# Patient Record
Sex: Female | Born: 2000 | Race: White | Hispanic: No | Marital: Single | Smoking: Smoker, current status unknown
Health system: Southern US, Community
[De-identification: ages and names within clinical notes are randomized; demographics above are authoritative.]

## PROBLEM LIST (undated history)

## (undated) DIAGNOSIS — F329 Major depressive disorder, single episode, unspecified: Secondary | ICD-10-CM

## (undated) DIAGNOSIS — R569 Unspecified convulsions: Secondary | ICD-10-CM

## (undated) DIAGNOSIS — F32A Depression, unspecified: Secondary | ICD-10-CM

## (undated) DIAGNOSIS — T43222A Poisoning by selective serotonin reuptake inhibitors, intentional self-harm, initial encounter: Secondary | ICD-10-CM

## (undated) DIAGNOSIS — F419 Anxiety disorder, unspecified: Secondary | ICD-10-CM

## (undated) DIAGNOSIS — H539 Unspecified visual disturbance: Secondary | ICD-10-CM

## (undated) DIAGNOSIS — R519 Headache, unspecified: Secondary | ICD-10-CM

## (undated) DIAGNOSIS — E669 Obesity, unspecified: Secondary | ICD-10-CM

## (undated) DIAGNOSIS — R51 Headache: Secondary | ICD-10-CM

## (undated) DIAGNOSIS — T424X2A Poisoning by benzodiazepines, intentional self-harm, initial encounter: Secondary | ICD-10-CM

## (undated) HISTORY — DX: Unspecified convulsions: R56.9

## (undated) HISTORY — DX: Anxiety disorder, unspecified: F41.9

## (undated) HISTORY — DX: Headache: R51

## (undated) HISTORY — DX: Headache, unspecified: R51.9

## (undated) HISTORY — DX: Unspecified visual disturbance: H53.9

---
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
## 2020-01-02 DIAGNOSIS — G40B09 Juvenile myoclonic epilepsy, not intractable, without status epilepticus: Secondary | ICD-10-CM | POA: Diagnosis not present

## 2020-01-02 DIAGNOSIS — B360 Pityriasis versicolor: Secondary | ICD-10-CM | POA: Diagnosis not present

## 2020-01-02 DIAGNOSIS — Z0289 Encounter for other administrative examinations: Secondary | ICD-10-CM | POA: Diagnosis not present

## 2020-01-02 DIAGNOSIS — Z0001 Encounter for general adult medical examination with abnormal findings: Secondary | ICD-10-CM | POA: Diagnosis not present

## 2020-01-02 DIAGNOSIS — Z23 Encounter for immunization: Secondary | ICD-10-CM | POA: Diagnosis not present

## 2020-01-02 DIAGNOSIS — Z68.41 Body mass index (BMI) pediatric, greater than or equal to 95th percentile for age: Secondary | ICD-10-CM | POA: Diagnosis not present

## 2020-01-02 DIAGNOSIS — F329 Major depressive disorder, single episode, unspecified: Secondary | ICD-10-CM | POA: Diagnosis not present

## 2020-01-02 DIAGNOSIS — Z713 Dietary counseling and surveillance: Secondary | ICD-10-CM | POA: Diagnosis not present

## 2020-01-10 ENCOUNTER — Ambulatory Visit: Payer: Medicaid Other | Admitting: Registered"

## 2020-01-14 ENCOUNTER — Other Ambulatory Visit: Payer: Self-pay

## 2020-01-14 ENCOUNTER — Emergency Department: Payer: Medicaid Other

## 2020-01-14 DIAGNOSIS — Z5321 Procedure and treatment not carried out due to patient leaving prior to being seen by health care provider: Secondary | ICD-10-CM | POA: Diagnosis not present

## 2020-01-14 DIAGNOSIS — G40909 Epilepsy, unspecified, not intractable, without status epilepticus: Secondary | ICD-10-CM | POA: Diagnosis not present

## 2020-01-14 DIAGNOSIS — R569 Unspecified convulsions: Secondary | ICD-10-CM | POA: Diagnosis not present

## 2020-01-14 DIAGNOSIS — M542 Cervicalgia: Secondary | ICD-10-CM | POA: Diagnosis not present

## 2020-01-14 DIAGNOSIS — R000 Tachycardia, unspecified: Secondary | ICD-10-CM | POA: Diagnosis not present

## 2020-01-14 DIAGNOSIS — R404 Transient alteration of awareness: Secondary | ICD-10-CM | POA: Diagnosis not present

## 2020-01-14 LAB — CBC
HCT: 38.7 % (ref 36.0–46.0)
Hemoglobin: 12.3 g/dL (ref 12.0–15.0)
MCH: 26.8 pg (ref 26.0–34.0)
MCHC: 31.8 g/dL (ref 30.0–36.0)
MCV: 84.3 fL (ref 80.0–100.0)
Platelets: 309 10*3/uL (ref 150–400)
RBC: 4.59 MIL/uL (ref 3.87–5.11)
RDW: 14.1 % (ref 11.5–15.5)
WBC: 7.7 10*3/uL (ref 4.0–10.5)
nRBC: 0 % (ref 0.0–0.2)

## 2020-01-14 LAB — BASIC METABOLIC PANEL
Anion gap: 9 (ref 5–15)
BUN: 16 mg/dL (ref 6–20)
CO2: 26 mmol/L (ref 22–32)
Calcium: 9.3 mg/dL (ref 8.9–10.3)
Chloride: 102 mmol/L (ref 98–111)
Creatinine, Ser: 0.76 mg/dL (ref 0.44–1.00)
GFR, Estimated: >60 mL/min (ref >60)
Glucose, Bld: 96 mg/dL (ref 70–99)
Potassium: 3.8 mmol/L (ref 3.5–5.1)
Sodium: 137 mmol/L (ref 135–145)

## 2020-01-14 NOTE — ED Triage Notes (Signed)
Patient reports seizure at home today. Patient woke up on kitchen floor and her mother and mother's boyfriend were standing over her. Patient reports pmh of seizures, takes Lamictal. Patient denies missing doses. Patient AO X 4 at this time.

## 2020-01-15 ENCOUNTER — Telehealth: Payer: Self-pay | Admitting: Emergency Medicine

## 2020-01-15 ENCOUNTER — Emergency Department
Admission: EM | Admit: 2020-01-15 | Discharge: 2020-01-15 | Disposition: A | Discharge status: Home / Self Care | Payer: Medicaid Other | Attending: Emergency Medicine | Admitting: Emergency Medicine

## 2020-01-15 NOTE — Telephone Encounter (Signed)
Called patient due to lwot to inquire about condition and follow up plans. Left message.   

## 2020-01-16 ENCOUNTER — Ambulatory Visit (INDEPENDENT_AMBULATORY_CARE_PROVIDER_SITE_OTHER): Payer: Medicaid Other | Admitting: Family

## 2020-01-16 DIAGNOSIS — G40B09 Juvenile myoclonic epilepsy, not intractable, without status epilepticus: Secondary | ICD-10-CM | POA: Diagnosis not present

## 2020-01-17 ENCOUNTER — Encounter (INDEPENDENT_AMBULATORY_CARE_PROVIDER_SITE_OTHER): Payer: Self-pay | Admitting: Family

## 2020-01-17 ENCOUNTER — Other Ambulatory Visit: Payer: Self-pay

## 2020-01-17 ENCOUNTER — Telehealth (INDEPENDENT_AMBULATORY_CARE_PROVIDER_SITE_OTHER): Payer: Medicaid Other | Admitting: Family

## 2020-01-17 VITALS — Ht 64.25 in | Wt 299.6 lb

## 2020-01-17 DIAGNOSIS — F410 Panic disorder [episodic paroxysmal anxiety] without agoraphobia: Secondary | ICD-10-CM

## 2020-01-17 DIAGNOSIS — G40B09 Juvenile myoclonic epilepsy, not intractable, without status epilepticus: Secondary | ICD-10-CM

## 2020-01-17 NOTE — Progress Notes (Signed)
This is a Pediatric Specialist E-Visit follow up consult provided via  MyChart Cynthia [name] consented to an E-Visit consult today.  Location of patient: Cynthia [name] is at Home(location) Location of provider: Elveria Risingina Len Azeez, NP is at Office (location) Patient was referred by Jackelyn PolingBonney, Warren K, MD   The following participants were involved in this E-Visit: Lorre MunroeFabiola Cardenas, CMA              Elveria Risingina Teniyah Seivert, NP Total time on call: 25 min Follow up: 3 months  Patient: Cynthia BetterKaitlyn Iriah [name] MRN: [id number] Sex: female DOB: 12/28/2000   Provider: Elveria Risingina Brin Ruggerio NP-C [facility] Care[facility]  Visit type: Routine Follow-Up  Last visit: 09/25/2019  Referral source: Jonetta SpeakWarren Bonney, MD History from: Patient, chart  Brief history:  Copied from previous record: History of primary generalized epilepsy, migraines, panic and anxiety. She is taking LamotrigineERand Clonazepam for seizures, andFluoxetine and Hydroxyzine for her mood disorder. She has been [facility] by [facility] for anxiety and panic, which is typically triggered by disagreements with her parents. She also has some anxiety in crowds.She used to take Trokendi XR for migraine prevention but has been able to successfully taper off that medication. She has been hospitalized for overdose attempts with the most recent being in October 2020.  Today's concerns: Cynthia [name] reports to me today that she had a seizure at home on October 17th. She said that she has been working 2nd shift and going to school in the mornings. She is getting about 5 hours of sleep, and the day the seizure occurred she admitted to be tired. Cynthia [name] denies any missed medications. She said that she was not injured in the course of the seizure. EMS was called and she was taken [facility] ER. She said that she was triaged then sent to wait in the lobby After learning that she would likely end up waiting for 10 hours before being  seen, she left the ER and went home.   Cynthia [name] said that she has moved back to her biological mother's home because of disagreements with her stepmother. She said that after a particularly angry event, that her stepmother told her that she was not welcome in that home anymore. Cynthia [name] said that living with her mother has worked out well. She says that her mood is good and that she does not have any thoughts of being sad or down. She is enjoying her job and school, but has talked with her manager about changing her shift so that she can get more sleep each night since the seizure occurred.   Cynthia [name] continues to gain weight and is working with a Scientific laboratory techniciandietician. She has been otherwise generally healthy since she was last seen. She has no other health concerns today other than previously mentioned.   Review of systems: Please see HPI for neurologic and other pertinent review of systems. Otherwise all other systems were reviewed and were negative.  Problem List: Patient Active Problem List   Diagnosis Date Noted  . Intentional drug overdose (HCC)   . Severe episode of recurrent major depressive disorder, without psychotic features (HCC) 07/14/2018  . Migraine without aura and without status migrainosus, not intractable 05/27/2018  . Panic attacks 12/31/2016  . Frequent headaches 03/16/2016  . 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 Anxiety    Phreesia 09/11/2019  . Depression   . Headache   . Intentional SSRI (selective  serotonin reuptake inhibitor) overdose, initial encounter (HCC) 01/10/2019  . Overdose of benzodiazepine, intentional self-harm, initial encounter (HCC) 01/10/2019  . Seizure (HCC)   . Seizures (HCC)    Phreesia 09/11/2019  . Vision abnormalities     Past medical history comments: See HPI Copied from previous record: She took Keppra in the past but was changed to Trokendi XR in 2017  because of ongoing seizures and for migraine prevention.An EEG performed January 08, 2017 was normal.An EEG performed February 19, 2016 was consistent with primary generalized epilepsy.An EEG done on February 07, 2018 was normal awake and asleep.Keppra was restarted in 2019 because of seizure activity but stopped due to side effects  Surgical history: Past Surgical History:  Procedure Laterality Date  . TONSILLECTOMY Bilateral 2007   Performed at [facility]  . TYMPANOSTOMY TUBE PLACEMENT Bilateral 2004   Performed at [facility]     Family history: family history includes Anxiety disorder in her maternal aunt, maternal grandmother, and mother; Autism in an other family member; Bipolar disorder in her maternal aunt, maternal grandmother, and mother; Depression in her maternal aunt, maternal grandmother, and mother; Hypertension in her father, maternal grandfather, maternal grandmother, mother, paternal grandfather, and paternal grandmother; Lung cancer in her paternal grandfather; Migraines in her maternal aunt, maternal grandfather, maternal grandmother, and mother; Seizures in an other family member.   Social history: Social History   Socioeconomic History  . Marital status: Single    Spouse name: Not on file  . Number of children: Not on file  . Years of education: Not on file  . Highest education level: Not on file  Occupational History  . Not on file  Tobacco Use  . Smoking status: Never Smoker  . Smokeless tobacco: Never Used  Vaping Use  . Vaping Use: Never used  Substance and Sexual Activity  . Alcohol use: No  . Drug use: No  . Sexual activity: Yes  Other Topics Concern  . 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   She attended [employer].    Lives with her father, step-mother, younger paternal half sister, and family dog.   Will be attending [employer] in the fall.   She will be majoring in Nursing   Social Determinants of  Health   Financial Resource Strain:   . Difficulty of Paying Living Expenses: Not on file  Food Insecurity:   . Worried About Programme researcher, broadcasting/film/video in the Last Year: Not on file  . Ran Out of Food in the Last Year: Not on file  Transportation Needs:   . Lack of Transportation (Medical): Not on file  . Lack of Transportation (Non-Medical): Not on file  Physical Activity:   . Days of Exercise per Week: Not on file  . Minutes of Exercise per Session: Not on file  Stress:   . Feeling of Stress : Not on file  Social Connections:   . Frequency of Communication with Friends and Family: Not on file  . Frequency of Social Gatherings with Friends and Family: Not on file  . Attends Religious Services: Not on file  . Active Member of Clubs or Organizations: Not on file  . Attends Banker Meetings: Not on file  . Marital Status: Not on file  Intimate Partner Violence:   . Fear of Current or Ex-Partner: Not on file  . Emotionally Abused: Not on file  . Physically Abused: Not on file  . Sexually  Abused: Not on file    Past/failed meds: Copied from previous record: Keppra - side effects Trokendi XR - improved headaches  Allergies: Allergies  Allergen Reactions  . Tape Dermatitis    When regular hospital tape applied to arm, swelling and redness developed at the site. Where paper tape applied, no reaction noted.     Immunizations: Immunization History  Administered Date(s) Administered  . Influenza,inj,Quad PF,6+ Mos 01/12/2019     Diagnostics/Screenings: Copied from previous record: rEEG 02/07/18 - normal awake and asleep. Regino Schultze, MD  rEEG 02/19/16 - abnormal with the patient awake, drowsy and asleep. The presence of generalized spike and wave discharge is consistent with her primary generalized epilepsy and raises the risk of recurrent seizures. Regino Schultze, MD  Physical Exam: Ht 5' 4.25" (1.632 m) Comment: reported  Wt 299 lb 9.6 oz (135.9 kg) Comment:  reported  BMI 51.03 kg/m    Wt Readings from Last 3 Encounters:  01/17/20 299 lb 9.6 oz (135.9 kg) (>99 %, Z= 2.77)*  01/14/20 290 lb (131.5 kg) (>99 %, Z= 2.73)*  04/19/19 240 lb (108.9 kg) (>99 %, Z= 2.38)*   * Growth percentiles are based on CDC (Girls, 2-20 Years) data.   General: Well developed, well nourished, obese young woman, seated at her home, in no evident distress, sandy hair, blue eyes, right handed Head: Head normocephalic and atraumatic Neck: Supple Musculoskeletal: No obvious deformities or scoliosis Skin: No rashes or neurocutaneous lesions  Neurologic Exam Mental Status: Awake and fully alert.  Oriented to place and time.  Recent and remote memory intact.  Attention span, concentration, and fund of knowledge appropriate.  Mood and affect appropriate. Cranial Nerves: Extraocular movements full without nystagmus. Hearing intact and symmetric to voice on video.  Facial sensation intact.  Face and tongue move normally and symmetrically.  Neck flexion and extension normal. Motor: Normal functional bulk, tone and strength Sensory: Intact to touch and temperature in all extremities.  Coordination: Finger-to-nose performed accurately bilaterally. Gait and Station: Arises from chair without difficulty.  Stance is normal. Gait demonstrates normal stride length and balance.   Impression: 1. Primary generalized epilepsy 2. Migraine without aura 3. Anxiety and panic disorder 4. Depression with previous intentional overdose attempts  Recommendations for plan of care: The patient's previous [facility] records were reviewed. Taneshia has neither had nor required imaging or lab studies since the last visit. She is an 19 year old young woman with history of primary generalized epilepsy, migraine headaches, anxiety and panic disorders, and history of depression with previous intentional overdose attempts. She is taking and tolerating Lamotrigine ER and Clonazepam for seizures and had  remained seizure free for some time until she had a seizure in the setting of sleep deprivation on October 17th. I talked with [facility] and reviewed the need for her to get at least 8 hours of sleep each night and continuing to be compliant with her medication. I told her that she should not drive on days in which she did not get sufficient sleep the night before. I asked Tonette to let me know if she any more seizures.   Liseth's mood has improved over time and she is doing well at this time. Unfortunately she has gained considerable weight and I encouraged her to continue to work with a dietician to get back to a healthy weight for her.  I will otherwise see her back in follow up in 3 months or sooner if needed. Tameah agreed with the plans made today.  The medication list was reviewed and reconciled. No changes were made in the prescribed medications today. A complete medication list was provided to the patient.  Allergies as of 01/17/2020      Reactions   Tape Dermatitis   When regular hospital tape applied to arm, swelling and redness developed at the site. Where paper tape applied, no reaction noted.       Medication List       Accurate as of January 17, 2020 11:04 AM. If you have any questions, ask your nurse or doctor.        FLUoxetine 20 MG capsule Commonly known as: PROZAC Take 1 capsule (20 mg total) by mouth daily.   hydrOXYzine 25 MG tablet Commonly known as: ATARAX/VISTARIL Take 1 tablet (25 mg total) by mouth at bedtime as needed for anxiety.   LamoTRIgine 300 MG Tb24 24 hour tablet Take 1 tablet at bedtime   MELATONIN PO Take by mouth.   metroNIDAZOLE 500 MG tablet Commonly known as: FLAGYL Take 1 tablet (500 mg total) by mouth 2 (two) times daily.   Nexplanon 68 MG Impl implant Generic drug: etonogestrel 1 each by Subdermal route once.   pantoprazole 40 MG tablet Commonly known as: Protonix Take 1 tablet per day   UNABLE TO FIND keratin        Total time spent with the patient was 25 minutes, of which 50% or more was spent in counseling and coordination of care.  [name] NP-C [facility] Ph. [phone] Fax [phone]

## 2020-01-20 ENCOUNTER — Encounter (INDEPENDENT_AMBULATORY_CARE_PROVIDER_SITE_OTHER): Payer: Self-pay | Admitting: Family

## 2020-01-20 NOTE — Patient Instructions (Signed)
Thank you for meeting with me by video visit today.   Instructions for you until your next appointment are as follows: 1. Continue taking your medications as prescribed 2. Remember that it is very important for you to get at least 8 hours of sleep each night 3. Let me know if you have any more seizures 4. Continue working with a dietician about your weight 5. Please sign up for MyChart if you have not done so 6. Please plan to return for follow up in 3 months or sooner if needed.

## 2020-02-05 ENCOUNTER — Ambulatory Visit: Payer: Self-pay | Admitting: Registered"

## 2020-02-08 ENCOUNTER — Ambulatory Visit: Payer: Medicaid Other | Admitting: Registered"

## 2020-03-06 ENCOUNTER — Encounter: Payer: Medicaid Other | Attending: Family | Admitting: Registered"

## 2020-04-08 ENCOUNTER — Other Ambulatory Visit (INDEPENDENT_AMBULATORY_CARE_PROVIDER_SITE_OTHER): Payer: Self-pay | Admitting: Family

## 2020-04-08 DIAGNOSIS — F410 Panic disorder [episodic paroxysmal anxiety] without agoraphobia: Secondary | ICD-10-CM

## 2020-04-08 DIAGNOSIS — K219 Gastro-esophageal reflux disease without esophagitis: Secondary | ICD-10-CM

## 2020-04-08 DIAGNOSIS — G40B09 Juvenile myoclonic epilepsy, not intractable, without status epilepticus: Secondary | ICD-10-CM

## 2020-04-08 MED ORDER — HYDROXYZINE HCL 25 MG PO TABS: 25.0000 mg | ORAL_TABLET | Freq: Every evening | ORAL | 2 refills | Status: DC | PRN

## 2020-04-08 MED ORDER — PANTOPRAZOLE SODIUM 40 MG PO TBEC: DELAYED_RELEASE_TABLET | ORAL | 2 refills | Status: DC

## 2020-04-08 MED ORDER — LAMOTRIGINE ER 300 MG PO TB24: ORAL_TABLET | ORAL | 5 refills | Status: DC

## 2020-04-08 MED ORDER — FLUOXETINE HCL 20 MG PO CAPS: 20.0000 mg | ORAL_CAPSULE | Freq: Every day | ORAL | 2 refills | Status: DC

## 2020-04-13 ENCOUNTER — Other Ambulatory Visit: Payer: Self-pay

## 2020-04-13 ENCOUNTER — Encounter (HOSPITAL_COMMUNITY): Payer: Self-pay | Admitting: Emergency Medicine

## 2020-04-13 ENCOUNTER — Emergency Department (HOSPITAL_COMMUNITY)
Admission: EM | Admit: 2020-04-13 | Discharge: 2020-04-14 | Disposition: A | Discharge status: Home / Self Care | Payer: Medicaid Other | Attending: Emergency Medicine | Admitting: Emergency Medicine

## 2020-04-13 DIAGNOSIS — K625 Hemorrhage of anus and rectum: Secondary | ICD-10-CM | POA: Diagnosis not present

## 2020-04-13 LAB — COMPREHENSIVE METABOLIC PANEL
ALT: 31 U/L (ref 0–44)
AST: 28 U/L (ref 15–41)
Albumin: 4.2 g/dL (ref 3.5–5.0)
Alkaline Phosphatase: 72 U/L (ref 38–126)
Anion gap: 10 (ref 5–15)
BUN: 18 mg/dL (ref 6–20)
CO2: 27 mmol/L (ref 22–32)
Calcium: 9.2 mg/dL (ref 8.9–10.3)
Chloride: 104 mmol/L (ref 98–111)
Creatinine, Ser: 1.19 mg/dL — ABNORMAL HIGH (ref 0.44–1.00)
GFR, Estimated: >60 mL/min (ref >60)
Glucose, Bld: 101 mg/dL — ABNORMAL HIGH (ref 70–99)
Potassium: 4 mmol/L (ref 3.5–5.1)
Sodium: 141 mmol/L (ref 135–145)
Total Bilirubin: 0.8 mg/dL (ref 0.3–1.2)
Total Protein: 7.9 g/dL (ref 6.5–8.1)

## 2020-04-13 LAB — CBC
HCT: 40 % (ref 36.0–46.0)
Hemoglobin: 12.6 g/dL (ref 12.0–15.0)
MCH: 26.5 pg (ref 26.0–34.0)
MCHC: 31.5 g/dL (ref 30.0–36.0)
MCV: 84 fL (ref 80.0–100.0)
Platelets: 325 10*3/uL (ref 150–400)
RBC: 4.76 MIL/uL (ref 3.87–5.11)
RDW: 15.4 % (ref 11.5–15.5)
WBC: 9.2 10*3/uL (ref 4.0–10.5)
nRBC: 0 % (ref 0.0–0.2)

## 2020-04-13 LAB — I-STAT BETA HCG BLOOD, ED (MC, WL, AP ONLY): I-stat hCG, quantitative: <5 m[IU]/mL (ref <5)

## 2020-04-13 LAB — TYPE AND SCREEN
ABO/RH(D): A POS
Antibody Screen: NEGATIVE

## 2020-04-13 LAB — POC OCCULT BLOOD, ED: Fecal Occult Bld: NEGATIVE

## 2020-04-13 NOTE — Discharge Instructions (Addendum)
There was no blood in your stool tonight.  The blood tests were normal.  Urine did not show blood either. Follow up with your doctor  to discuss further evaluation

## 2020-04-13 NOTE — ED Provider Notes (Signed)
[facility]-EMERGENCY DEPT Provider Note   CSN: [id number] Arrival date & time: 04/13/20  1510     History Chief Complaint  Patient presents with  . Rectal Bleeding    Cynthia [name] is a 20 y.o. female.  HPI   Patient states she had an episode of bleeding this morning when she went to use the bathroom.  Patient states she urinated this morning and when she wiped she noticed blood on the toilet tissue.  Patient was not sure if it was coming from her bladder or her rectum.  She is not having any trouble with any abdominal pain.  No vomiting or diarrhea.  Patient does not think it is vaginal bleeding because she states she does not have any menstrual periods with her controlled medications.  She denies any abdominal pain.  No lightheadedness.  Past Medical History:  Diagnosis Date  . Anxiety    Phreesia 09/11/2019  . Depression   . Headache   . Intentional SSRI (selective serotonin reuptake inhibitor) overdose, initial encounter (HCC) 01/10/2019  . Overdose of benzodiazepine, intentional self-harm, initial encounter (HCC) 01/10/2019  . Seizure (HCC)   . Seizures (HCC)    Phreesia 09/11/2019  . Vision abnormalities     Patient Active Problem List   Diagnosis Date Noted  . Intentional drug overdose (HCC)   . Severe episode of recurrent major depressive disorder, without psychotic features (HCC) 07/14/2018  . Migraine without aura and without status migrainosus, not intractable 05/27/2018  . Panic attacks 12/31/2016  . Frequent headaches 03/16/2016  . 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 TONSILLECTOMY Bilateral 2007   Performed at [facility]  . TYMPANOSTOMY TUBE PLACEMENT Bilateral 2004   Performed at [facility]     OB History   No obstetric history on file.     Family History  Problem Relation Age of Onset  . Migraines Mother   . Bipolar  disorder Mother   . Depression Mother   . Anxiety disorder Mother   . Hypertension Mother   . Hypertension Father   . Migraines Maternal Grandmother   . Bipolar disorder Maternal Grandmother   . Depression Maternal Grandmother   . Anxiety disorder Maternal Grandmother   . Hypertension Maternal Grandmother   . Migraines Maternal Grandfather   . Hypertension Maternal Grandfather   . Hypertension Paternal Grandmother   . Lung cancer Paternal Grandfather   . Hypertension Paternal Grandfather   . Migraines Maternal Aunt   . Bipolar disorder Maternal Aunt        Both Maternal Aunts   . Depression Maternal Aunt   . Anxiety disorder Maternal Aunt   . Seizures Other        MGA  . Autism Other        Maternal 1st    Social History   Tobacco Use  . Smoking status: Never Smoker  . Smokeless tobacco: Never Used  Vaping Use  . Vaping Use: Never used  Substance Use Topics  . Alcohol use: No  . Drug use: No    Home Medications Prior to Admission medications   Medication Sig Start Date End Date Taking? Authorizing Provider  etonogestrel (NEXPLANON) 68 MG IMPL implant 1 each by Subdermal route once.    Yes [provider]  FLUoxetine (PROZAC) 20 MG capsule Take 1 capsule (20 mg total) by mouth daily. 04/08/20  Yes [name], NP  hydrOXYzine (ATARAX/VISTARIL)  25 MG tablet Take 1 tablet (25 mg total) by mouth at bedtime as needed for anxiety. 04/08/20  Yes [name], NP  LamoTRIgine 300 MG TB24 24 hour tablet Take 1 tablet at bedtime 04/08/20  Yes Goodpasture, Inetta Fermo, NP  MELATONIN PO Take 1 tablet by mouth at bedtime as needed (sleep).   Yes [provider]  metroNIDAZOLE (FLAGYL) 500 MG tablet Take 1 tablet (500 mg total) by mouth 2 (two) times daily. Patient not taking: No sig reported 11/20/19   Reva Bores, MD  pantoprazole (PROTONIX) 40 MG tablet Take 1 tablet per day Patient taking differently: Take 40 mg by mouth daily as needed (heartburn). 04/08/20    [name], NP    Allergies    Tape  Review of Systems   Review of Systems  All other systems reviewed and are negative.   Physical Exam Updated Vital Signs BP 102/62   Pulse 80   Temp 99.7 F (37.6 C) (Oral)   Resp 16   Ht 1.638 m (5' 4.5")   Wt (!) 137 kg   SpO2 100%   BMI 51.04 kg/m   Physical Exam Vitals and nursing note reviewed.  Constitutional:      General: She is not in acute distress.    Appearance: She is well-developed and well-nourished.  HENT:     Head: Normocephalic and atraumatic.     Right Ear: External ear normal.     Left Ear: External ear normal.  Eyes:     General: No scleral icterus.       Right eye: No discharge.        Left eye: No discharge.     Conjunctiva/sclera: Conjunctivae normal.  Neck:     Trachea: No tracheal deviation.  Cardiovascular:     Rate and Rhythm: Normal rate and regular rhythm.     Pulses: Intact distal pulses.  Pulmonary:     Effort: Pulmonary effort is normal. No respiratory distress.     Breath sounds: Normal breath sounds. No stridor. No wheezing or rales.  Abdominal:     General: Bowel sounds are normal. There is no distension.     Palpations: Abdomen is soft.     Tenderness: There is no abdominal tenderness. There is no guarding or rebound.  Genitourinary:    Comments: No external hemorrhoid noted, no gross blood noted on rectal exam Musculoskeletal:        General: No tenderness or edema.     Cervical back: Neck supple.  Skin:    General: Skin is warm and dry.     Findings: No rash.  Neurological:     Mental Status: She is alert.     Cranial Nerves: No cranial nerve deficit (no facial droop, extraocular movements intact, no slurred speech).     Sensory: No sensory deficit.     Motor: No abnormal muscle tone or seizure activity.     Coordination: Coordination normal.     Deep Tendon Reflexes: Strength normal.  Psychiatric:        Mood and Affect: Mood and affect normal.     ED Results /  Procedures / Treatments   Labs (all labs ordered are listed, but only abnormal results are displayed) Labs Reviewed  COMPREHENSIVE METABOLIC PANEL - Abnormal; Notable for the following components:      Result Value   Glucose, Bld 101 (*)    Creatinine, Ser 1.19 (*)    All other components within normal limits  CBC  URINALYSIS, ROUTINE W REFLEX MICROSCOPIC  POC OCCULT BLOOD, ED  I-STAT BETA HCG BLOOD, ED ([facility], [facility], [facility] ONLY)  TYPE AND SCREEN  ABO/RH    EKG None  Radiology No results found.  Procedures Procedures (including critical care time)  Medications Ordered in ED Medications - No data to display  ED Course  I have reviewed the triage vital signs and the nursing notes.  Pertinent labs & imaging results that were available during my care of the patient were reviewed by me and considered in my medical decision making (see chart for details).  Clinical Course as of 04/13/20 2251  Sat Apr 13, 2020  2216 Patient's Hemoccult is negative. [JK]  2216 CBC and metabolic panel unremarkable. [JK]    Clinical Course User Index [JK] Linwood Dibbles, MD   MDM Rules/Calculators/A&P                          Patient presented to ED with complaints of rectal bleeding.  Patient was otherwise asymptomatic.  In the ED rectal exam did not show any evidence of blood.  No hemorrhoid was noted.  Laboratory tests are unremarkable.  Patient had urinated before she saw the blood so urinalysis is pending.  It is possible she noticed hematuria.  Ua suggestive of contamination.  Stable for discharge.  Output follow up prn if sx return.  ?anal fissure, internal hemorrhoid  Final Clinical Impression(s) / ED Diagnoses Final diagnoses:  Rectal bleeding    Rx / DC Orders ED Discharge Orders    None       Linwood Dibbles, MD 04/14/20 1302

## 2020-04-13 NOTE — ED Triage Notes (Signed)
Patient c/o one episode of dark red blood from rectum this morning. Reports abdominal pain. Denies N/V.

## 2020-04-13 NOTE — ED Notes (Signed)
Pt made aware urine specimen needed.  

## 2020-04-14 LAB — URINALYSIS, ROUTINE W REFLEX MICROSCOPIC
Bilirubin Urine: NEGATIVE
Glucose, UA: NEGATIVE mg/dL
Hgb urine dipstick: NEGATIVE
Ketones, ur: NEGATIVE mg/dL
Nitrite: NEGATIVE
Protein, ur: NEGATIVE mg/dL
Specific Gravity, Urine: 1.025 (ref 1.005–1.030)
pH: 5 (ref 5.0–8.0)

## 2020-04-18 DIAGNOSIS — K5900 Constipation, unspecified: Secondary | ICD-10-CM | POA: Diagnosis not present

## 2020-04-19 ENCOUNTER — Telehealth (INDEPENDENT_AMBULATORY_CARE_PROVIDER_SITE_OTHER): Payer: Medicaid Other | Admitting: Family

## 2020-04-25 ENCOUNTER — Telehealth (INDEPENDENT_AMBULATORY_CARE_PROVIDER_SITE_OTHER): Payer: Medicaid Other | Admitting: Family

## 2020-04-25 ENCOUNTER — Other Ambulatory Visit: Payer: Self-pay

## 2020-04-25 VITALS — Ht 64.5 in | Wt 305.0 lb

## 2020-04-25 DIAGNOSIS — G40B09 Juvenile myoclonic epilepsy, not intractable, without status epilepticus: Secondary | ICD-10-CM

## 2020-04-25 DIAGNOSIS — F410 Panic disorder [episodic paroxysmal anxiety] without agoraphobia: Secondary | ICD-10-CM

## 2020-04-25 DIAGNOSIS — Z8659 Personal history of other mental and behavioral disorders: Secondary | ICD-10-CM

## 2020-04-25 NOTE — Progress Notes (Signed)
This is a Pediatric Specialist E-Visit follow up consult provided via Caregility Video Jacayla Nordell Intriago consented to an E-Visit consult today.  Location of patient: Cynthia [name] is at home Location of provider: Damita Dunnings is at office Patient was referred by Jackelyn Poling, MD   The following participants were involved in this E-Visit: CMA, NP and patient  Chief Complain/ Reason for E-Visit today: seizure follow up Total time on call: 15 minutes Follow up: 3 months  Cynthia [name]   MRN:  [id number]  2000/06/06   Provider: [name] NP-C Location of Care: [facility]  Visit type: Routine Follow-Up  Last visit: 01/17/2020  Referral source: Starr Lake, MD History from: patient, [facility] chart  Brief history:  Copied from previous record: History of primary generalized epilepsy, migraines, panic and anxiety. She is taking LamotrigineERand Clonazepam for seizures, andFluoxetineand Hydroxyzinefor her mood disorder. She has been [facility] by [facility] for anxiety and panic, which is typically triggered by disagreements with her parents. She also has some anxiety in crowds.She used to take Trokendi XR for migraine prevention but has been able to successfully taper off that medication. She has been hospitalized for overdose attempts with the most recent being in October 2020.  Today's concerns: [name] reports today that she has remained seizure free since her last visit and says that she is compliant with her medication. She has a busy schedule with college classes and 2 part time jobs, but says that she gets at least 8 hours of sleep most nights.   [name] reports that her mood is good and that she is happy being busy and active. She says that her anxiety is much better than in the past.   [name] currently has Covid-19 infection and says that she is weathering that well. She has been otherwise generally healthy since she was  last seen. She has no other health concerns today other than previously mentioned.  Review of systems: Please see HPI for neurologic and other pertinent review of systems. Otherwise all other systems were reviewed and were negative.  Problem List: Patient Active Problem List   Diagnosis Date Noted  . Intentional drug overdose (HCC)   . Severe episode of recurrent major depressive disorder, without psychotic features (HCC) 07/14/2018  . Migraine without aura and without status migrainosus, not intractable 05/27/2018  . Panic attacks 12/31/2016  . Frequent headaches 03/16/2016  . 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 Anxiety    Phreesia 09/11/2019  . Depression   . Depression    Phreesia 04/25/2020  . Headache   . Intentional SSRI (selective serotonin reuptake inhibitor) overdose, initial encounter (HCC) 01/10/2019  . Overdose of benzodiazepine, intentional self-harm, initial encounter (HCC) 01/10/2019  . Seizure (HCC)   . Seizures (HCC)    Phreesia 09/11/2019  . Vision abnormalities     Past medical history comments: See HPI Copied from previous record: She took Keppra in the past but was changed to Trokendi XR in 2017 because of ongoing seizures and for migraine prevention.An EEG performed January 08, 2017 was normal.An EEG performed February 19, 2016 was consistent with primary generalized epilepsy.An EEG done on February 07, 2018 was normal awake and asleep.Keppra was restarted in 2019 because of seizure activity but stopped due to side effects  Surgical history: Past Surgical History:  Procedure Laterality Date  . TONSILLECTOMY Bilateral 2007   Performed at [facility]  .  TYMPANOSTOMY TUBE PLACEMENT Bilateral 2004   Performed at [facility]     Family history: family history includes Anxiety disorder in her maternal aunt, maternal grandmother, and mother; Autism in an other family  member; Bipolar disorder in her maternal aunt, maternal grandmother, and mother; Depression in her maternal aunt, maternal grandmother, and mother; Hypertension in her father, maternal grandfather, maternal grandmother, mother, paternal grandfather, and paternal grandmother; Lung cancer in her paternal grandfather; Migraines in her maternal aunt, maternal grandfather, maternal grandmother, and mother; Seizures in an other family member.   Social history: Social History   Socioeconomic History  . Marital status: Single    Spouse name: Not on file  . Number of children: Not on file  . Years of education: Not on file  . Highest education level: Not on file  Occupational History  . Not on file  Tobacco Use  . Smoking status: Never Smoker  . Smokeless tobacco: Never Used  Vaping Use  . Vaping Use: Never used  Substance and Sexual Activity  . Alcohol use: No  . Drug use: No  . Sexual activity: Yes  Other Topics Concern  . 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   She attended [employer].    Lives with her father, step-mother, younger paternal half sister, and family dog.   She is attending [employer] in [place] for Nursing.   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    Social Determinants of Health   Financial Resource Strain: Not on file  Food Insecurity: Not on file  Transportation Needs: Not on file  Physical Activity: Not on file  Stress: Not on file  Social Connections: Not on file  Intimate Partner Violence: Not on file    Past/failed meds: Copied from previous record: Keppra - side effects Trokendi XR - improved headaches  Allergies: Allergies  Allergen Reactions  . Tape Dermatitis    When regular hospital tape applied to arm, swelling and redness developed at the site. Where paper tape applied, no reaction noted.      Immunizations: Immunization History  Administered Date(s) Administered  . Influenza,inj,Quad PF,6+  Mos 01/12/2019     Diagnostics/Screenings: Copied from previous record: rEEG 02/07/18 - normal awake and asleep. Regino Schultze, MD  rEEG 02/19/16 - abnormal with the patient awake, drowsy and asleep. The presence of generalized spike and wave discharge is consistent with her primary generalized epilepsy and raises the risk of recurrent seizures. Regino Schultze, MD   Physical Exam: Ht 5' 4.5" (1.638 m) Comment: reported  Wt (!) 305 lb (138.3 kg) Comment: reported  BMI 51.54 kg/m   Wt Readings from Last 3 Encounters:  04/25/20 (!) 305 lb (138.3 kg) (>99 %, Z= 2.83)*  04/13/20 (!) 302 lb (137 kg) (>99 %, Z= 2.81)*  01/17/20 299 lb 9.6 oz (135.9 kg) (>99 %, Z= 2.77)*   * Growth percentiles are based on CDC (Girls, 2-20 Years) data.   General: Well developed, well nourished obese young woman, seated at her home, in no evident distress, sandy hair, blue eyes, right handed Head: Head normocephalic and atraumatic. Neck: Supple Musculoskeletal: No obvious deformities or scoliosis Skin: No rashes or neurocutaneous lesions  Neurologic Exam Mental Status: Awake and fully alert.  Oriented to place and time.  Recent and remote memory intact.  Attention span, concentration, and fund of knowledge appropriate.  Mood and affect appropriate. Cranial Nerves: Extraocular movements full without nystagmus. Hearing intact and  symmetric to voice on video.  Facial sensation intact.  Face tongue, palate move normally and symmetrically.  Motor: Normal functional bulk, tone and strength Sensory: Intact to touch and temperature in all extremities.  Coordination: Finger-to-nose and heel-to shin performed accurately bilaterally.  Gait and Station: Arises from chair without difficulty.  Stance is normal. Gait demonstrates normal stride length and balance.   Impression: 1. Primary generalized epilepsy 2. Migraine without aura 3. Anxiety and panic disorder 4. Depression with previous intentional overdose  attempts  Recommendations for plan of care: The patient's previous [facility] records were reviewed. Kaileah has neither had nor required imaging or lab studies since the last visit. She is a 20 year old young woman with history of primary generalized epilepsy, migraine headaches, anxiety and panic disorder, as well as history of depression. She is taking and tolerating Lamotrigine and has remained seizure free. She is taking Fluoxetine for her mood disorder and is doing well at this time. Kenyah is sick today with Covid-19 infection so I did not talk with her about her weight gain. I will see her back in follow up in 3 months or sooner if needed. Jeriyah agreed with the plans made today.   The medication list was reviewed and reconciled. No changes were made in the prescribed medications today. A complete medication list was provided to the patient.  Allergies as of 04/25/2020      Reactions   Tape Dermatitis   When regular hospital tape applied to arm, swelling and redness developed at the site. Where paper tape applied, no reaction noted.       Medication List       Accurate as of April 25, 2020 11:59 PM. If you have any questions, ask your nurse or doctor.        STOP taking these medications   metroNIDAZOLE 500 MG tablet Commonly known as: FLAGYL Stopped by: [name], NP     TAKE these medications   FLUoxetine 20 MG capsule Commonly known as: PROZAC Take 1 capsule (20 mg total) by mouth daily.   hydrOXYzine 25 MG tablet Commonly known as: ATARAX/VISTARIL Take 1 tablet (25 mg total) by mouth at bedtime as needed for anxiety.   LamoTRIgine 300 MG Tb24 24 hour tablet Take 1 tablet at bedtime   MELATONIN PO Take 1 tablet by mouth at bedtime as needed (sleep).   Nexplanon 68 MG Impl implant Generic drug: etonogestrel 1 each by Subdermal route once.   pantoprazole 40 MG tablet Commonly known as: Protonix Take 1 tablet per day       Total time spent with the  patient was 15 minutes, of which 50% or more was spent in counseling and coordination of care.  [name] NP-C [facility] Child [facility]rology Ph. [phone] Fax [phone]

## 2020-04-28 ENCOUNTER — Encounter (INDEPENDENT_AMBULATORY_CARE_PROVIDER_SITE_OTHER): Payer: Self-pay | Admitting: Family

## 2020-04-28 DIAGNOSIS — Z8659 Personal history of other mental and behavioral disorders: Secondary | ICD-10-CM | POA: Insufficient documentation

## 2020-04-28 NOTE — Patient Instructions (Signed)
Thank you for meeting with me by video today.   Instructions for you until your next appointment are as follows: 1. Continue taking your seizure medicine as prescribed. Try not to miss any doses.  2. Continue taking the Fluoxetine as prescribed. Try not to miss any doses.  3. Let me know if you have any seizures.  4. Please plan to return for follow up in 3 months or sooner if needed.

## 2020-05-17 DIAGNOSIS — R4020 Unspecified coma: Secondary | ICD-10-CM | POA: Diagnosis not present

## 2020-05-17 DIAGNOSIS — I10 Essential (primary) hypertension: Secondary | ICD-10-CM | POA: Diagnosis not present

## 2020-05-17 DIAGNOSIS — R404 Transient alteration of awareness: Secondary | ICD-10-CM | POA: Diagnosis not present

## 2020-05-17 DIAGNOSIS — R569 Unspecified convulsions: Secondary | ICD-10-CM | POA: Diagnosis not present

## 2020-07-01 ENCOUNTER — Encounter (INDEPENDENT_AMBULATORY_CARE_PROVIDER_SITE_OTHER): Payer: Self-pay

## 2020-07-01 ENCOUNTER — Telehealth (INDEPENDENT_AMBULATORY_CARE_PROVIDER_SITE_OTHER): Payer: Medicaid Other | Admitting: Family

## 2020-07-01 ENCOUNTER — Encounter (INDEPENDENT_AMBULATORY_CARE_PROVIDER_SITE_OTHER): Payer: Self-pay | Admitting: Family

## 2020-07-01 DIAGNOSIS — G40B09 Juvenile myoclonic epilepsy, not intractable, without status epilepticus: Secondary | ICD-10-CM | POA: Diagnosis not present

## 2020-07-01 DIAGNOSIS — Z8659 Personal history of other mental and behavioral disorders: Secondary | ICD-10-CM

## 2020-07-01 NOTE — Progress Notes (Signed)
This is a Pediatric Specialist E-Visit follow up consult provided via MyChart video. However about 5 minutes into the visit, the sound was lost and we could not hear each other. The visit was converted to phone visit.  Cynthia Mussel Smitheyconsented to an E-Visit consult today.  Location of patient: [name] is at Home(location) Location of provider: [name], NP is at Office (location) Patient was referred by Jackelyn Poling, MD   The following participants were involved in this E-Visit: Lorre Munroe, CMA              [name], NP Total time on call: 20 min Follow up: 2 weeks  Patient: Cynthia [name] MRN: [id number] Sex: female DOB: 12-31-2000   Provider: [name] NP-C Location of Care: [facility]  Visit type: Routine Follow-Up  Last visit: 04/25/2020  Referral source: Starr Lake, MD History from: patient, [facility] Chart  Brief history:  Copied from previous record: History of primary generalized epilepsy, migraines, panic and anxiety. She is taking LamotrigineERand Clonazepam for seizures, andFluoxetineand Hydroxyzinefor her mood disorder. She has been [facility] by [facility] for anxiety and panic, which is typically triggered by disagreements with herparents. She also has some anxiety in crowds.She used to take Trokendi XR for migraine prevention but has been able to successfully taper off that medication. She has been hospitalized for overdose attempts with the most recent being in October 2020.  Today's concerns: Cynthia [name] reports today that she has remained seizure free but that she has been under considerable stress. She reports that her grandmother and an uncle died, her other grandmother is hospitalized with severe stroke, her aunt has been diagnosed with cancer in her spine, her father and stepmother broke up and her younger sister attempted suicide,and when discharged from the hospital moved to [place].  Cynthia [name] reports feeling down and sad, but denies any thoughts or intentions about harming herself. She is currently living with a roommate but plans to move in with her father, as she feels that would be beneficial for both of them. She is not seeing a therapist at this time.   Cynthia [name] has been otherwise generally healthy since her last visit. She has no other health concerns today other than previously mentioned.   Review of systems: Please see HPI for neurologic and other pertinent review of systems. Otherwise all other systems were reviewed and were negative.  Problem List: Patient Active Problem List   Diagnosis Date Noted  . History of depression 04/28/2020  . History of anxiety 04/28/2020  . Intentional drug overdose (HCC)   . Severe episode of recurrent major depressive disorder, without psychotic features (HCC) 07/14/2018  . Migraine without aura and without status migrainosus, not intractable 05/27/2018  . Panic attacks 12/31/2016  . Frequent headaches 03/16/2016  . 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 Anxiety    Phreesia 09/11/2019  . Depression   . Depression    Phreesia 04/25/2020  . Headache   . Intentional SSRI (selective serotonin reuptake inhibitor) overdose, initial encounter (HCC) 01/10/2019  . Overdose of benzodiazepine, intentional self-harm, initial encounter (HCC) 01/10/2019  . Seizure (HCC)   . Seizures (HCC)    Phreesia 09/11/2019  . Vision abnormalities     Past medical history comments: See HPI Copied from previous record: She took Keppra in the past but was changed to Trokendi XR in 2017 because of ongoing seizures and for  migraine prevention.An EEG performed January 08, 2017 was normal.An EEG performed February 19, 2016 was consistent with primary generalized epilepsy.An EEG done on February 07, 2018 was normal awake and asleep.Keppra was restarted in  2019 because of seizure activity but stopped due to side effects  Surgical history: Past Surgical History:  Procedure Laterality Date  . TONSILLECTOMY Bilateral 2007   Performed at [facility]  . TYMPANOSTOMY TUBE PLACEMENT Bilateral 2004   Performed at [facility]     Family history: family history includes Anxiety disorder in her maternal aunt, maternal grandmother, and mother; Autism in an other family member; Bipolar disorder in her maternal aunt, maternal grandmother, and mother; Depression in her maternal aunt, maternal grandmother, and mother; Hypertension in her father, maternal grandfather, maternal grandmother, mother, paternal grandfather, and paternal grandmother; Lung cancer in her paternal grandfather; Migraines in her maternal aunt, maternal grandfather, maternal grandmother, and mother; Seizures in an other family member.   Social history: Social History   Socioeconomic History  . Marital status: Single    Spouse name: Not on file  . Number of children: Not on file  . Years of education: Not on file  . Highest education level: Not on file  Occupational History  . Not on file  Tobacco Use  . Smoking status: Smoker, Current Status Unknown  . Smokeless tobacco: Never Used  Vaping Use  . Vaping Use: Never used  Substance and Sexual Activity  . Alcohol use: No  . Drug use: No  . Sexual activity: Yes  Other Topics Concern  . 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   She attended [employer].    Lives with her father, step-mother, younger paternal half sister, and family dog.   She is attending [employer] in [place] for Nursing.   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    Social Determinants of Health   Financial Resource Strain: Not on file  Food Insecurity: Not on file  Transportation Needs: Not on file  Physical Activity: Not on file  Stress: Not on file  Social Connections: Not on file  Intimate Partner Violence: Not on  file     Past/failed meds: Copied from previous record: Keppra - side effects Trokendi XR - improved headaches Lamotrigine - side effects - has tolerated Lamotrigine ER well  Allergies: Allergies  Allergen Reactions  . Tape Dermatitis    When regular hospital tape applied to arm, swelling and redness developed at the site. Where paper tape applied, no reaction noted.      Immunizations: Immunization History  Administered Date(s) Administered  . Influenza,inj,Quad PF,6+ Mos 01/12/2019  . Moderna Sars-Covid-2 Vaccination 02/04/2020     Diagnostics/Screenings: Copied from previous record: rEEG 02/07/18 - normal awake and asleep. Regino Schultze, MD  rEEG 02/19/16 - abnormal with the patient awake, drowsy and asleep. The presence of generalized spike and wave discharge is consistent with her primary generalized epilepsy and raises the risk of recurrent seizures. Regino Schultze, MD   Physical Exam: There were no vitals taken for this visit.  There was no examination as it was a virtual visit.   Impression: Nonintractable juvenile myoclonic epilepsy without status epilepticus (HCC)  History of depression  History of anxiety   Recommendations for plan of care: The patient's previous [facility] records were reviewed. Nilda Simmer has neither had nor required imaging or lab studies since the last visit. She is a 20 year old girl with history of epilepsy,  migraines, anxiety and panic. She has remained seizure free on Lamotrigine ER. Lunabella is also taking and tolerating Fluoxetine for mood and had been doing well until recently when she has experienced some losses of family members. I talked with [name] about her grief and recommended that she get re-established with her therapist. I asked Corinthian to call me in 2 weeks to let me know how she was doing or sooner if needed. I told her that if her mood worsened and if she had thoughts about harming herself to go to ED. She agreed with the plans made  today.   The medication list was reviewed and reconciled. No changes were made in the prescribed medications today. A complete medication list was provided to the patient.  Allergies as of 07/01/2020      Reactions   Tape Dermatitis   When regular hospital tape applied to arm, swelling and redness developed at the site. Where paper tape applied, no reaction noted.       Medication List       Accurate as of July 01, 2020 11:59 PM. If you have any questions, ask your nurse or doctor.        FLUoxetine 20 MG capsule Commonly known as: PROZAC Take 1 capsule (20 mg total) by mouth daily.   hydrOXYzine 25 MG tablet Commonly known as: ATARAX/VISTARIL Take 1 tablet (25 mg total) by mouth at bedtime as needed for anxiety.   LamoTRIgine 300 MG Tb24 24 hour tablet Take 1 tablet at bedtime   MELATONIN PO Take 1 tablet by mouth at bedtime as needed (sleep).   Nexplanon 68 MG Impl implant Generic drug: etonogestrel 1 each by Subdermal route once.   pantoprazole 40 MG tablet Commonly known as: Protonix Take 1 tablet per day       Total time spent with the patient was 20 minutes, of which 50% or more was spent in counseling and coordination of care.  [name] NP-C [facility] Child Neurology Ph. [phone] Fax [phone]

## 2020-07-03 ENCOUNTER — Encounter (INDEPENDENT_AMBULATORY_CARE_PROVIDER_SITE_OTHER): Payer: Self-pay | Admitting: Family

## 2020-07-03 NOTE — Patient Instructions (Signed)
Thank you for talking with me by phone today.   Instructions for you until your next appointment are as follows: 1. Contact your therapist for an appointment. It is important for you to get re-established with therapy to deal with the losses you have experienced recently.  2. If your mood worsens and you feel like harming yourself, go to the ED for evaluation 3. Call me in 2 weeks to let me know how you are doing.   At [facility], we are committed to providing exceptional care. You will receive a patient satisfaction survey through text or email regarding your visit today. Your opinion is important to me. Comments are appreciated.

## 2020-07-18 ENCOUNTER — Other Ambulatory Visit (INDEPENDENT_AMBULATORY_CARE_PROVIDER_SITE_OTHER): Payer: Self-pay | Admitting: Family

## 2020-07-18 DIAGNOSIS — F410 Panic disorder [episodic paroxysmal anxiety] without agoraphobia: Secondary | ICD-10-CM

## 2020-07-19 NOTE — Telephone Encounter (Signed)
Please escribe

## 2020-08-01 ENCOUNTER — Encounter (INDEPENDENT_AMBULATORY_CARE_PROVIDER_SITE_OTHER): Payer: Self-pay

## 2020-08-10 ENCOUNTER — Encounter (INDEPENDENT_AMBULATORY_CARE_PROVIDER_SITE_OTHER): Payer: Self-pay

## 2020-08-10 DIAGNOSIS — F410 Panic disorder [episodic paroxysmal anxiety] without agoraphobia: Secondary | ICD-10-CM

## 2020-08-10 DIAGNOSIS — Z8659 Personal history of other mental and behavioral disorders: Secondary | ICD-10-CM

## 2020-08-11 MED ORDER — FLUOXETINE HCL 40 MG PO CAPS: ORAL_CAPSULE | ORAL | 5 refills | Status: DC

## 2020-09-12 DIAGNOSIS — Z20822 Contact with and (suspected) exposure to covid-19: Secondary | ICD-10-CM | POA: Diagnosis not present

## 2020-10-13 ENCOUNTER — Other Ambulatory Visit (INDEPENDENT_AMBULATORY_CARE_PROVIDER_SITE_OTHER): Payer: Self-pay | Admitting: Family

## 2020-10-13 DIAGNOSIS — F410 Panic disorder [episodic paroxysmal anxiety] without agoraphobia: Secondary | ICD-10-CM

## 2020-10-13 DIAGNOSIS — G40B09 Juvenile myoclonic epilepsy, not intractable, without status epilepticus: Secondary | ICD-10-CM

## 2020-11-19 ENCOUNTER — Other Ambulatory Visit (HOSPITAL_COMMUNITY)
Admission: RE | Admit: 2020-11-19 | Discharge: 2020-11-19 | Disposition: A | Discharge status: Home / Self Care | Payer: Medicaid Other | Source: Ambulatory Visit | Attending: Obstetrics and Gynecology | Admitting: Obstetrics and Gynecology

## 2020-11-19 ENCOUNTER — Encounter: Payer: Self-pay | Admitting: Obstetrics and Gynecology

## 2020-11-19 ENCOUNTER — Other Ambulatory Visit: Payer: Self-pay

## 2020-11-19 ENCOUNTER — Ambulatory Visit (INDEPENDENT_AMBULATORY_CARE_PROVIDER_SITE_OTHER): Payer: Medicaid Other | Admitting: Obstetrics and Gynecology

## 2020-11-19 VITALS — BP 146/85 | HR 96 | Wt 318.0 lb

## 2020-11-19 DIAGNOSIS — Z113 Encounter for screening for infections with a predominantly sexual mode of transmission: Secondary | ICD-10-CM | POA: Insufficient documentation

## 2020-11-19 DIAGNOSIS — Z3046 Encounter for surveillance of implantable subdermal contraceptive: Secondary | ICD-10-CM | POA: Diagnosis not present

## 2020-11-19 DIAGNOSIS — Z30017 Encounter for initial prescription of implantable subdermal contraceptive: Secondary | ICD-10-CM

## 2020-11-19 MED ORDER — ETONOGESTREL 68 MG ~~LOC~~ IMPL
68.0000 mg | DRUG_IMPLANT | Freq: Once | SUBCUTANEOUS | Status: AC
Start: 2020-11-19 — End: 2020-11-19
  Administered 2020-11-19: 68 mg via SUBCUTANEOUS

## 2020-11-19 NOTE — Procedures (Signed)
Nexplanon Removal and Insertion Procedure Note Patient has had the Nexplanon for three years and desires another one; she is amenorrheic on it.  Medications reviewed and no issues with contraceptive or medication efficacy.  Prior to the procedure being performed, the patient was asked to state their full name, date of birth, type of procedure being performed and the exact location of the operative site. This information was then checked against the documentation in the patient's chart. Prior to the procedure being performed, a "time out" was performed by the physician that confirmed the correct patient, procedure and site.  After informed consent was obtained, the patient's left arm was examined and the Nexplanon rod was noted to be somewhat deep but still palpable. The area was cleaned with alcohol then local anesthesia was infiltrated with 3 ml of 1% lidocaine with epinephrine. The area was prepped with betadine. Using sterile technique, the Nexplanon device was actually easily brought to the incision site. The capsule was scrapped off with the scalpel, the Nexplanon grasped with hemostats, and easily removed; the removal site was hemostatic. The Nexplanon was inspected and noted to be intact.  A steri-strip and a pressure dressing was applied.  I decided to place it in a new spot to not inadvertently use the old track and have it placed deep.   A site was marked approximately 8 cm proximal to the medial epicondyle in the below the sulcus between the biceps and triceps on the inner surface. The area was cleaned with alcohol then local anesthesia was infiltrated with 3 ml of 1% lidocaine along the planned insertion track. The area was prepped with betadine. Using sterile technique the Nexplanon device was inserted per manufacturer's guidelines in the subdermal connective tissue using the standard insertion technique without difficulty. Pressure was applied and the insertion site was hemostatic. The  presence of the Nexplanon was confirmed immediately after insertion by palpation by both me and the patient and by checking the tip of needle for the absence of the insert.  A pressure dressing was applied.   The patient tolerated the procedure well.  Cornelia Copa MD Attending [facility] [employer] [facility])

## 2020-11-20 LAB — CERVICOVAGINAL ANCILLARY ONLY
Chlamydia: NEGATIVE
Comment: NEGATIVE
Comment: NEGATIVE
Comment: NORMAL
Neisseria Gonorrhea: NEGATIVE
Trichomonas: NEGATIVE

## 2020-11-20 LAB — RPR: RPR Ser Ql: NONREACTIVE

## 2020-11-20 LAB — HIV ANTIBODY (ROUTINE TESTING W REFLEX): HIV Screen 4th Generation wRfx: NONREACTIVE

## 2020-11-20 LAB — HEPATITIS C ANTIBODY: Hep C Virus Ab: <0.1 s/co ratio (ref 0.0–0.9)

## 2020-11-20 LAB — HEPATITIS B SURFACE ANTIGEN: Hepatitis B Surface Ag: NEGATIVE

## 2020-11-21 ENCOUNTER — Encounter (INDEPENDENT_AMBULATORY_CARE_PROVIDER_SITE_OTHER): Payer: Self-pay | Admitting: Family

## 2020-11-21 ENCOUNTER — Other Ambulatory Visit: Payer: Self-pay

## 2020-11-21 ENCOUNTER — Ambulatory Visit (INDEPENDENT_AMBULATORY_CARE_PROVIDER_SITE_OTHER): Payer: Medicaid Other | Admitting: Family

## 2020-11-21 VITALS — BP 124/88 | HR 84 | Ht 64.0 in | Wt 321.4 lb

## 2020-11-21 DIAGNOSIS — Z8659 Personal history of other mental and behavioral disorders: Secondary | ICD-10-CM

## 2020-11-21 DIAGNOSIS — F332 Major depressive disorder, recurrent severe without psychotic features: Secondary | ICD-10-CM

## 2020-11-21 DIAGNOSIS — R635 Abnormal weight gain: Secondary | ICD-10-CM | POA: Diagnosis not present

## 2020-11-21 DIAGNOSIS — G43009 Migraine without aura, not intractable, without status migrainosus: Secondary | ICD-10-CM

## 2020-11-21 DIAGNOSIS — G40B09 Juvenile myoclonic epilepsy, not intractable, without status epilepticus: Secondary | ICD-10-CM | POA: Diagnosis not present

## 2020-11-21 DIAGNOSIS — F509 Eating disorder, unspecified: Secondary | ICD-10-CM

## 2020-11-21 MED ORDER — TOPIRAMATE 25 MG PO TABS
ORAL_TABLET | ORAL | 1 refills | Status: DC
Start: 2020-11-21 — End: 2021-02-12

## 2020-11-21 MED ORDER — FLUOXETINE HCL 10 MG PO TABS
ORAL_TABLET | ORAL | 1 refills | Status: DC
Start: 2020-11-21 — End: 2021-01-16

## 2020-11-21 NOTE — Progress Notes (Addendum)
Cynthia [name]   MRN:  [id number]  2000-10-05   Provider: [name] NP-C Location of Care: [facility]  Visit type: Routine visit  Last visit: 07/01/2020  Referral source: Jonetta Speak, MD History from: patient and chcn chart  Brief history:  Copied from previous record: History of primary generalized epilepsy, migraines, panic and anxiety. She is taking Lamotrigine ER and Clonazepam for seizures, and Fluoxetine and Hydroxyzine for her mood disorder. Her last seizure occurred 01/14/20 in the setting of sleep deprivation. She has been seen by [facility] for anxiety and panic, which is typically triggered by disagreements with her parents. She also has some anxiety in crowds. She used to take Trokendi XR for migraine prevention but has been able to successfully taper off that medication. She has been hospitalized for overdose attempts with the most recent being in October 2020.  Today's concerns: Mosella reports today that she has remained seizure free since her last visit. She has had some headaches but they have not been very severe.   Becci has gained considerable weight and she tells me today that she is concerned that she may have an eating disorder. She says that she does not eat during the day, but then eats a very large dinner and has trouble stopping eating. Then she feels guilty for eating so much and tends to vomit. She says that while her overall mood has been good, that she feels bad about herself for overeating every night but once she starts eating that she has trouble stopping until she vomits. Lillion also reports feeling shaky and weak at times but has not had syncope.   Erminia is not working or in school this semester but plans to return next semester. 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 She has been otherwise generally healthy since she was last seen. She has no other health concerns  today other than previously mentioned.  Review of systems: Please see HPI for neurologic and other pertinent review of systems. Otherwise all other systems were reviewed and were negative.  Problem List: Patient Active Problem List   Diagnosis Date Noted   History of depression 04/28/2020   History of anxiety 04/28/2020   Intentional drug overdose (HCC)    Severe episode of recurrent major depressive disorder, without psychotic features (HCC) 07/14/2018   Migraine without aura and without status migrainosus, not intractable 05/27/2018   Panic attacks 12/31/2016   Frequent headaches 03/16/2016   Nonintractable juvenile myoclonic epilepsy without status epilepticus (HCC) 04/18/2015     Past Medical History:  Diagnosis Date   Anxiety    Phreesia 09/11/2019   Depression    Depression    Phreesia 04/25/2020   Headache    Intentional SSRI (selective serotonin reuptake inhibitor) overdose, initial encounter (HCC) 01/10/2019   Overdose of benzodiazepine, intentional self-harm, initial encounter (HCC) 01/10/2019   Seizure (HCC)    Seizures (HCC)    Phreesia 09/11/2019   Vision abnormalities     Past medical history comments: See HPI Copied from previous record: She took Keppra in the past but was changed to Trokendi XR in 2017 because of ongoing seizures and for migraine prevention. An EEG performed January 08, 2017 was normal. 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 An EEG done on February 07, 2018 was normal awake and asleep. Keppra was restarted in 2019 because of seizure activity but stopped due to side effects  Surgical history: Past Surgical History:  Procedure Laterality Date   TONSILLECTOMY Bilateral 2007   Performed at [facility]   TYMPANOSTOMY TUBE PLACEMENT Bilateral 2004   Performed at [facility]     Family history: family history includes Anxiety disorder in her maternal aunt, maternal grandmother, and mother; Autism in an other  family member; Bipolar disorder in her maternal aunt, maternal grandmother, and mother; Depression in her maternal aunt, maternal grandmother, and mother; Hypertension in her father, maternal grandfather, maternal grandmother, mother, paternal grandfather, and paternal grandmother; Lung cancer in her paternal grandfather; Migraines in her maternal aunt, maternal grandfather, maternal grandmother, and mother; Seizures in an other family member.   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   She attended [employer].    Lives with her father, step-mother, younger paternal half sister, and family dog.   She is attending [employer] in [place] for Nursing.   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    Social Determinants of Health   Financial Resource Strain: Not on file  Food Insecurity: Not on file  Transportation Needs: Not on file  Physical Activity: Not on file  Stress: Not on file  Social Connections: Not on file  Intimate Partner Violence: Not on file    Past/failed meds: Copied from previous record: Keppra - side effects Trokendi XR - improved headaches Lamotrigine - side effects - has tolerated Lamotrigine ER well  Allergies: Allergies  Allergen Reactions   Tape Dermatitis    When regular hospital tape applied to arm, swelling and redness developed at the site. Where paper tape applied, no reaction noted.       Immunizations: Immunization History  Administered Date(s) Administered   Influenza,inj,Quad PF,6+ Mos 01/12/2019   Moderna Sars-Covid-2 Vaccination 02/04/2020     Diagnostics/Screenings: Copied from previous record: rEEG 02/07/18 - normal awake and asleep. Regino Schultze, MD   rEEG 02/19/16 - abnormal with the patient awake, drowsy and asleep. The presence of generalized spike and wave discharge is consistent with her primary generalized epilepsy and raises the risk of recurrent seizures. Regino Schultze, MD  Physical Exam: BP 124/88   Pulse 84   Ht 5\' 4"  (1.626 m)   Wt (!) 321 lb 6.4 oz (145.8 kg)   BMI 55.17 kg/m   Wt Readings from Last 3 Encounters:  11/21/20 (!) 321 lb 6.4 oz (145.8 kg) (>99 %, Z= 2.96)*  11/19/20 (!) 318 lb (144.2 kg) (>99 %, Z= 2.95)*  04/25/20 (!) 305 lb (138.3 kg) (>99 %, Z= 2.83)*   * Growth percentiles are based on CDC (Girls, 2-20 Years) data.    General: Morbidly obese, well developed, well nourished adolescent girl, seated on exam table, in no evident distress, sandy hair, blue eyes, right handed Head: Head normocephalic and atraumatic.  Oropharynx benign. Neck:  Supple Cardiovascular: Regular rate and rhythm, no murmurs Respiratory: Breath sounds clear to auscultation Musculoskeletal: No obvious deformities or scoliosis Skin: No rashes or neurocutaneous lesions  Neurologic Exam Mental Status: Awake and fully alert.  Oriented to place and time.  Recent and remote memory intact.  Attention span, concentration, and fund of knowledge appropriate.  Mood and affect appropriate. Cranial Nerves: Fundoscopic exam reveals sharp disc margins.  Pupils equal, briskly reactive to light.  Extraocular movements full without nystagmus.  Visual fields full to confrontation.  Hearing intact and symmetric to finger rub.  Facial sensation intact.  Face tongue, palate move normally and symmetrically.  Neck flexion and extension normal. Motor: Normal bulk and tone.  Normal strength in all tested extremity muscles. Sensory: Intact to touch and temperature in all extremities.  Coordination: Rapid alternating movements normal in all extremities.  Finger-to-nose and heel-to shin performed accurately bilaterally.  Romberg negative. Gait and Station: Arises from chair without difficulty.  Stance is normal. Gait demonstrates normal stride length and balance.   Able to heel, toe and tandem walk without difficulty. Reflexes: 1+ and symmetric. Toes downgoing.   Impression: Nonintractable juvenile myoclonic epilepsy without status epilepticus (HCC)  Migraine without aura and without status migrainosus, not intractable - Plan: topiramate (TOPAMAX) 25 MG tablet  Severe episode of recurrent major depressive disorder, without psychotic features (HCC)  History of depression - Plan: FLUoxetine (PROZAC) 10 MG tablet, Ambulatory referral to Adolescent Medicine  Weight gain - Plan: Comprehensive metabolic panel, HgB A1c, HgB A1c, Comprehensive metabolic panel, Ambulatory referral to Adolescent Medicine  Eating disorder, unspecified type - Plan: Ambulatory referral to Adolescent Medicine  History of anxiety   Recommendations for plan of care: [facility]. [name] has neither had nor required imaging or lab studies since the last visit. She is a 20 year old girl with history of juvenile myoclonic epilepsy, migraine headaches, history of depression and anxiety, morbid obesity and likely has an eating disorder. She is taking and tolerating Lamotrigine ER and Clonazepam for seizures, and has remained seizure free since January 14, 2020. [name] has been having some migraine headaches, and I recommended that she start Topiramate for migraine prevention. I instructed her to drink plenty of water while taking this medication.   I talked with [name] about her weight gain and her concerns about an eating disorder. I [facility] for help with that and gave her resources on how to get established with a therapist. I ordered some lab studies today and will call Maame when I receive the results. I am concerned about the possibility of hyperglycemia with her weight gain and reports of feeling weak and shaky at times. I am also concerned that her mood may worsen with her guilt feelings about food, and recommended a small increase in Fluoxetine. I cautioned Sabriel to let me or her family know if her mood worsens, and if she has thoughts about harming herself.  I will see Zaidee back in follow up in 6 weeks or sooner if needed. She agreed with the plans made today.   The medication list was reviewed and reconciled. I reviewed changes that were made in the prescribed medications today. A complete medication list was provided to the patient.  Orders Placed This Encounter  Procedures   Comprehensive metabolic panel    Standing Status:   Future    Number of Occurrences:   1    Standing Expiration Date:  02/21/2021    Order Specific Question:   Has the patient fasted?    Answer:   Yes   HgB A1c    Standing Status:   Future    Number of Occurrences:   1    Standing Expiration Date:   02/21/2021   Ambulatory referral to Adolescent Medicine    Referral Priority:   Routine    Referral Type:   Consultation    Referral Reason:   Specialty Services Required    Requested Specialty:   Pediatrics    Number of Visits Requested:   1    Return in about 6 weeks (around 01/02/2021).   Allergies as of 11/21/2020       Reactions   Tape Dermatitis   When regular hospital tape applied to arm, swelling and redness developed at the site. Where paper tape applied, no reaction noted.         Medication List        Accurate as of November 21, 2020 11:59 PM. If you have any questions, ask your nurse or doctor.          FLUoxetine 40 MG capsule Commonly known as: PROZAC Take 1 capsule per day What changed:  Another medication with the same name was added. Make sure you understand how and when to take each. Changed by: [name], NP   FLUoxetine 10 MG tablet Commonly known as: PROZAC Take 1 tablet per day. This in addition to the 40mg  capsule for total of 50mg  What changed: You were already taking a medication with the same name, and this prescription was added. Make sure you understand how and when to take each. Changed by: , NP   hydrOXYzine 25 MG tablet Commonly known as: ATARAX/VISTARIL TAKE 1 TABLET (25 MG TOTAL) BY MOUTH AT BEDTIME AS NEEDED FOR ANXIETY.   LamoTRIgine 300 MG Tb24 24 hour tablet TAKE 1 TABLET BY MOUTH EVERYDAY AT BEDTIME   MELATONIN PO Take 1 tablet by mouth at bedtime as needed (sleep).   Nexplanon 68 MG Impl implant Generic drug: etonogestrel 1 each by Subdermal route once.   topiramate 25 MG tablet Commonly known as: TOPAMAX Take 1 tablet at bedtime for 1 week, then take 2 tablets at bedtime. Started by: , NP        Total time spent with the patient was 35 minutes, of which 50% or more was spent in counseling and coordination of care.  [name] NP-C [facility] Ph. [phone] Fax [phone]

## 2020-11-21 NOTE — Patient Instructions (Addendum)
Thank you for coming in today.   Instructions for you until your next appointment are as follows: To find a therapist - go to psychologytoday/us/therapist I will refer you to [facility] for help with your eating disorder I have sent in a prescription for generic Prozac 10mg  tablets. Add that to the 40mg  capsule you take now I have sent in a prescription for Topiramate 25mg  - take 1 tablet at bedtime for 1 week, then take 2 tablets at bedtime after that I have ordered blood tests - I will call you when the results are available to  Work on eating small amounts during the day and exercising for about 20-30 minutes each day Be sure to drink plenty of water each day - at least 48-60 oz per day Please sign up for MyChart if you have not done so. Please plan to return for follow up in 6 weeks or sooner if needed.  At [facility], we are committed to providing exceptional care. You will receive a patient satisfaction survey through text or email regarding your visit today. Your opinion is important to me. Comments are appreciated.

## 2020-11-30 DIAGNOSIS — R635 Abnormal weight gain: Secondary | ICD-10-CM | POA: Insufficient documentation

## 2020-11-30 DIAGNOSIS — F509 Eating disorder, unspecified: Secondary | ICD-10-CM | POA: Insufficient documentation

## 2020-12-01 ENCOUNTER — Encounter (INDEPENDENT_AMBULATORY_CARE_PROVIDER_SITE_OTHER): Payer: Self-pay | Admitting: Family

## 2020-12-03 ENCOUNTER — Encounter (INDEPENDENT_AMBULATORY_CARE_PROVIDER_SITE_OTHER): Payer: Self-pay

## 2020-12-25 DIAGNOSIS — J029 Acute pharyngitis, unspecified: Secondary | ICD-10-CM | POA: Diagnosis not present

## 2020-12-30 ENCOUNTER — Encounter: Payer: Medicaid Other | Admitting: Clinical

## 2020-12-30 ENCOUNTER — Ambulatory Visit: Payer: Medicaid Other

## 2020-12-30 NOTE — BH Specialist Note (Deleted)
Integrated Behavioral Health Initial In-Person Visit  MRN: [id number] Name: Cynthia [name] [facility]  Number of Integrated Behavioral Health Clinician visits:: 1/6 Session Start time: ***  Session End time: *** Total time: {IBH Total Time:21014050} minutes  Types of Service: {CHL AMB TYPE OF SERVICE:5083158029}  Interpretor:{yes LD:357017} Interpretor Name and Language: ***   Subjective: Cynthia [name] is a 20 y.o. female accompanied by {CHL AMB ACCOMPANIED BL:3903009233} Patient was referred by [facility] for social emotional assessment due to concerns with mood & disordered eating. Patient reports the following symptoms/concerns: *** Duration of problem: ***; Severity of problem: {Mild/Moderate/Severe:20260}  Objective: Mood: {BHH MOOD:22306} and Affect: {BHH AFFECT:22307} Risk of harm to self or others: {CHL AMB BH Suicide Current Mental Status:21022748}  Life Context: Family and Social: *** School/Work: *** Self-Care: *** Life Changes: ***  Bio-Psycho Social History:  Health habits: Sleep:*** Eating habits/patterns: *** Water intake: *** Screen time: *** Exercise: ***  Gender identity: *** Sex assigned at birth: *** Pronouns: {he/she/they:23295} Tobacco?  {YES/NO/WILD AQTMA:26333} Drugs/ETOH?  {YES/NO/WILD LKTGY:56389} Partner preference?  {CHL AMB PARTNER PREFERENCE:[phone]}  Sexually Active?  {YES/NO/WILD HTDSK:87681}  Pregnancy Prevention:  {Pregnancy Prevention:9780232990} Reviewed condoms:  {YES/NO/WILD LXBWI:20355} Reviewed EC:  {YES/NO/WILD HRCBU:38453}   History or current traumatic events (natural disaster, house fire, etc.)? {YES/NO/WILD MIWOE:32122} History or current physical trauma?  {YES/NO/WILD QMGNO:03704} History or current emotional trauma?  {YES/NO/WILD UGQBV:69450} History or current sexual trauma?  {YES/NO/WILD TUUEK:80034} History or current domestic or intimate partner violence?  {YES/NO/WILD  JZPHX:50569} History of bullying:  {YES/NO/WILD VXYIA:16553}  Trusted adult at home/school:  {YES/NO/WILD CARDS:18581} Feels safe at home:  {YES/NO/WILD ZSMOL:07867} Trusted friends:  {YES/NO/WILD JQGBE:01007} Feels safe at school:  {YES/NO/WILD HQRFX:58832}  Suicidal or homicidal thoughts?   {YES/NO/WILD PQDIY:64158} Self injurious behaviors?  {YES/NO/WILD XENMM:76808} Auditory or Visual Disturbances/Hallucinations?   {YES/NO/WILD UPJSR:15945} Guns in the home?  {YES/NO/WILD OPFYT:24462}  Previous or Current Psychotherapy/Treatments  ***   Patient and/or Family's Strengths/Protective Factors: {CHL AMB BH PROTECTIVE [phone]}  Goals Addressed: Patient will: Reduce symptoms of: {IBH Symptoms:21014056} Increase knowledge and/or ability of: {IBH Patient Tools:21014057}  Demonstrate ability to: {IBH Goals:21014053}  Progress towards Goals: {CHL AMB BH PROGRESS TOWARDS [phone]}  Interventions: Interventions utilized: {IBH Interventions:21014054}  Standardized Assessments completed: {IBH Screening Tools:21014051}  Patient and/or Family Response: ***  Patient Centered Plan: Patient is on the following Treatment Plan(s):  ***  Assessment: Patient currently experiencing ***.   Patient may benefit from ***.  Plan: Follow up with behavioral health clinician on : *** Behavioral recommendations: *** Referral(s): {IBH Referrals:21014055} "From scale of 1-10, how likely are you to follow plan?": ***  Gordy Savers, LCSW

## 2021-01-02 ENCOUNTER — Ambulatory Visit (INDEPENDENT_AMBULATORY_CARE_PROVIDER_SITE_OTHER): Payer: Medicaid Other | Admitting: Family

## 2021-01-07 ENCOUNTER — Ambulatory Visit (INDEPENDENT_AMBULATORY_CARE_PROVIDER_SITE_OTHER): Payer: Medicaid Other | Admitting: Dermatology

## 2021-01-07 ENCOUNTER — Encounter: Payer: Self-pay | Admitting: Dermatology

## 2021-01-07 ENCOUNTER — Other Ambulatory Visit: Payer: Self-pay

## 2021-01-07 DIAGNOSIS — L219 Seborrheic dermatitis, unspecified: Secondary | ICD-10-CM

## 2021-01-07 DIAGNOSIS — L719 Rosacea, unspecified: Secondary | ICD-10-CM

## 2021-01-07 DIAGNOSIS — L920 Granuloma annulare: Secondary | ICD-10-CM | POA: Diagnosis not present

## 2021-01-07 DIAGNOSIS — R635 Abnormal weight gain: Secondary | ICD-10-CM | POA: Diagnosis not present

## 2021-01-07 MED ORDER — ELIDEL 1 % EX CREA: TOPICAL_CREAM | CUTANEOUS | 1 refills | Status: DC

## 2021-01-07 MED ORDER — METROCREAM 0.75 % EX CREA: TOPICAL_CREAM | CUTANEOUS | 1 refills | Status: DC

## 2021-01-07 MED ORDER — PIMECROLIMUS 1 % EX CREA: TOPICAL_CREAM | CUTANEOUS | 1 refills | Status: DC

## 2021-01-07 MED ORDER — CLOBETASOL PROP EMOLLIENT BASE 0.05 % EX CREA: TOPICAL_CREAM | CUTANEOUS | 0 refills | Status: DC

## 2021-01-07 MED ORDER — METRONIDAZOLE 0.75 % EX CREA: TOPICAL_CREAM | CUTANEOUS | 1 refills | Status: DC

## 2021-01-07 NOTE — Progress Notes (Signed)
Rxs needed stamped DAW for insurance.

## 2021-01-07 NOTE — Progress Notes (Signed)
   New Patient Visit  Subjective  Cynthia [name] is a 20 y.o. female who presents for the following: Skin Problem (Right foot. Dur: few months. Pink/purple area. Denies itching, non tender. ).  She also has pink, dry, flaky skin all over face.  Denies h/o eczema.  She states she has always had a pink discoloration of face.  No problems in scalp, but sometimes her eyebrows flake.   Review of Systems: No other skin or systemic complaints except as noted in HPI or Assessment and Plan.   Objective  Well appearing patient in no apparent distress; mood and affect are within normal limits.  A focused examination was performed including face, right foot. Relevant physical exam findings are noted in the Assessment and Plan.  Right Dorsum of Foot 3.5cm violaceous smooth plaque, no scale     Head - Anterior (Face) Erythema with xerosis at cheeks, nose, forehead and chin  Assessment & Plan  Granuloma annulare Right Dorsum of Foot  Granuloma annulare is a chronic benign skin condition characterized by pink smooth bumps or ring-like plaques most commonly appearing over the joints and the backs of the hands/feet. Its cause is not known, and most episodes of granuloma annulare clear up after a few years, with or without treatment.   Start Clobetasol cream BID. Once improving decrease to qd. Caution atrophy with long-term use.   Topical steroids (such as triamcinolone, fluocinolone, fluocinonide, mometasone, clobetasol, halobetasol, betamethasone, hydrocortisone) can cause thinning and lightening of the skin if they are used for too long in the same area. Your physician has selected the right strength medicine for your problem and area affected on the body. Please use your medication only as directed by your physician to prevent side effects.    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 Can increase to BID PRN flares  Rosacea is a chronic progressive skin condition usually affecting the face of adults, causing redness and/or acne bumps. It is treatable but not curable. It sometimes affects the eyes (ocular rosacea) as well. It may respond to topical and/or systemic medication and can flare with stress, sun exposure, alcohol, exercise and some foods.  Daily application of broad spectrum spf 30+ sunscreen to face is recommended to reduce flares.   Seborrheic Dermatitis  -  is a chronic persistent rash characterized by pinkness and scaling most commonly of the mid face but also can occur on the scalp (dandruff), ears; mid chest, mid back and groin.  It tends to be exacerbated by stress and cooler weather.  People who have neurologic disease may experience new onset or exacerbation of existing seborrheic dermatitis.  The condition is not curable but treatable and can be controlled.   pimecrolimus (ELIDEL) 1 % cream - Head - Anterior (Face) QAM to face  metroNIDAZOLE (METROCREAM) 0.75 % cream - Head - Anterior (Face) QHS to face  Return for GA and rosacea recheck in 1 month.  I, Lawson Radar, CMA, am acting as scribe for Willeen Niece, MD.  Documentation: I have reviewed the above documentation for accuracy and completeness, and I agree with the above.  Willeen Niece MD

## 2021-01-07 NOTE — Patient Instructions (Signed)
Topical steroids (such as triamcinolone, fluocinolone, fluocinonide, mometasone, clobetasol, halobetasol, betamethasone, hydrocortisone) can cause thinning and lightening of the skin if they are used for too long in the same area. Your physician has selected the right strength medicine for your problem and area affected on the body. Please use your medication only as directed by your physician to prevent side effects.    If you have any questions or concerns for your doctor, please call our main line at [phone] and press option 4 to reach your doctor's medical assistant. If no one answers, please leave a voicemail as directed and we will return your call as soon as possible. Messages left after 4 pm will be answered the following business day.   You may also send us a message via MyChart. We typically respond to MyChart messages within 1-2 business days.  For prescription refills, please ask your pharmacy to contact our office. Our fax number is [phone].  If you have an urgent issue when the clinic is closed that cannot wait until the next business day, you can page your doctor at the number below.    Please note that while we do our best to be available for urgent issues outside of office hours, we are not available 24/7.   If you have an urgent issue and are unable to reach us, you may choose to seek medical care at your doctor's office, retail clinic, urgent care center, or emergency room.  If you have a medical emergency, please immediately call 911 or go to the emergency department.  Pager Numbers  - Dr. Kowalski: [phone]  - Dr. Moye: [phone]  - Dr. Stewart: [phone]  In the event of inclement weather, please call our main line at [phone] for an update on the status of any delays or closures.  Dermatology Medication Tips: Please keep the boxes that topical medications come in in order to help keep track of the instructions about where and how to use  these. Pharmacies typically print the medication instructions only on the boxes and not directly on the medication tubes.   If your medication is too expensive, please contact our office at [phone] option 4 or send us a message through MyChart.   We are unable to tell what your co-pay for medications will be in advance as this is different depending on your insurance coverage. However, we may be able to find a substitute medication at lower cost or fill out paperwork to get insurance to cover a needed medication.   If a prior authorization is required to get your medication covered by your insurance company, please allow us 1-2 business days to complete this process.  Drug prices often vary depending on where the prescription is filled and some pharmacies may offer cheaper prices.  The website www.goodrx.com contains coupons for medications through different pharmacies. The prices here do not account for what the cost may be with help from insurance (it may be cheaper with your insurance), but the website can give you the price if you did not use any insurance.  - You can print the associated coupon and take it with your prescription to the pharmacy.  - You may also stop by our office during regular business hours and pick up a GoodRx coupon card.  - If you need your prescription sent electronically to a different pharmacy, notify our office through [facility] MyChart or by phone at [phone] option 4.  

## 2021-01-09 LAB — COMPREHENSIVE METABOLIC PANEL
ALT: 35 IU/L — ABNORMAL HIGH (ref 0–32)
AST: 20 IU/L (ref 0–40)
Albumin/Globulin Ratio: 1.8 (ref 1.2–2.2)
Albumin: 4.3 g/dL (ref 3.9–5.0)
Alkaline Phosphatase: 111 IU/L — ABNORMAL HIGH (ref 42–106)
BUN/Creatinine Ratio: 12 (ref 9–23)
BUN: 13 mg/dL (ref 6–20)
Bilirubin Total: 0.3 mg/dL (ref 0.0–1.2)
CO2: 21 mmol/L (ref 20–29)
Calcium: 9.5 mg/dL (ref 8.7–10.2)
Chloride: 104 mmol/L (ref 96–106)
Creatinine, Ser: 1.08 mg/dL — ABNORMAL HIGH (ref 0.57–1.00)
Globulin, Total: 2.4 g/dL (ref 1.5–4.5)
Glucose: 82 mg/dL (ref 70–99)
Potassium: 5 mmol/L (ref 3.5–5.2)
Sodium: 141 mmol/L (ref 134–144)
Total Protein: 6.7 g/dL (ref 6.0–8.5)

## 2021-01-09 LAB — HEMOGLOBIN A1C
Est. average glucose Bld gHb Est-mCnc: 105 mg/dL
Hgb A1c MFr Bld: 5.3 % (ref 4.8–5.6)

## 2021-01-11 ENCOUNTER — Other Ambulatory Visit (INDEPENDENT_AMBULATORY_CARE_PROVIDER_SITE_OTHER): Payer: Self-pay | Admitting: Family

## 2021-01-11 DIAGNOSIS — F410 Panic disorder [episodic paroxysmal anxiety] without agoraphobia: Secondary | ICD-10-CM

## 2021-01-15 ENCOUNTER — Telehealth: Payer: Self-pay

## 2021-01-15 NOTE — Telephone Encounter (Signed)
BH Clinician is not available for a joint visit with this patient tomorrow. I left VM for patient letting her know to arrive at 11am instead of 10:30. [facility] visit will need to be scheduled with patient tomorrow for screeners etc to be completed.

## 2021-01-16 ENCOUNTER — Encounter: Payer: Self-pay | Admitting: Family

## 2021-01-16 ENCOUNTER — Ambulatory Visit (INDEPENDENT_AMBULATORY_CARE_PROVIDER_SITE_OTHER): Payer: Medicaid Other | Admitting: Family

## 2021-01-16 ENCOUNTER — Other Ambulatory Visit: Payer: Self-pay

## 2021-01-16 ENCOUNTER — Encounter: Payer: Medicaid Other | Admitting: Licensed Clinical Social Worker

## 2021-01-16 VITALS — BP 122/81 | HR 85 | Ht 65.35 in | Wt 306.0 lb

## 2021-01-16 DIAGNOSIS — F509 Eating disorder, unspecified: Secondary | ICD-10-CM | POA: Diagnosis not present

## 2021-01-16 DIAGNOSIS — Z3202 Encounter for pregnancy test, result negative: Secondary | ICD-10-CM

## 2021-01-16 DIAGNOSIS — G40B09 Juvenile myoclonic epilepsy, not intractable, without status epilepticus: Secondary | ICD-10-CM | POA: Diagnosis not present

## 2021-01-16 DIAGNOSIS — Z113 Encounter for screening for infections with a predominantly sexual mode of transmission: Secondary | ICD-10-CM

## 2021-01-16 DIAGNOSIS — Z1389 Encounter for screening for other disorder: Secondary | ICD-10-CM

## 2021-01-16 DIAGNOSIS — F5002 Anorexia nervosa, binge eating/purging type: Secondary | ICD-10-CM

## 2021-01-16 DIAGNOSIS — F4323 Adjustment disorder with mixed anxiety and depressed mood: Secondary | ICD-10-CM | POA: Diagnosis not present

## 2021-01-16 LAB — POCT URINALYSIS DIPSTICK
Bilirubin, UA: NEGATIVE
Blood, UA: NEGATIVE
Glucose, UA: NEGATIVE
Ketones, UA: NEGATIVE
Leukocytes, UA: NEGATIVE
Nitrite, UA: NEGATIVE
Protein, UA: POSITIVE — AB
Spec Grav, UA: 1.02 (ref 1.010–1.025)
Urobilinogen, UA: NEGATIVE E.U./dL — AB
pH, UA: 5 (ref 5.0–8.0)

## 2021-01-16 LAB — POCT URINE PREGNANCY: Preg Test, Ur: NEGATIVE

## 2021-01-16 NOTE — Progress Notes (Addendum)
HPI:    Phone: [phone]  Goals for the visit:  Patient: not exactly sure what visit is for; talked about going somewhere for eating/derep  Concerns from home:  -eating concerns:  -depression:   Biggest concern is weight; back when COVID first started, staying at home a lot; eat when bored; does not eat during day; will snack but not eat; lived with dad and stepmom in HS and was doing school from home and was near kitchen; that's what started eating so much; moved out and in with mom and stopped eating; stopped eating all together and was still gaining a lot of weight; lowest weight was 160-170 lb. Not hungry but then will hold until she is super hungry; same access to food - was just different; when lived with dad and stepmom don't feel judged; with mom and her BF, feels judged. Will eat a little kid's plate portion then eat later. Relationship with stepmom was never really good - she kicked her out of her dad's house after an argument; she called her a whore - said maybe she would be better off living with her mom. Didn't really see her mom when she lived with stepmom and dad. Mom was in and out of prison from 82 yo to 70 yo she was out then met her BF and started getting her life together. From the time she was 9 until 31 she didn't see her mom.  When she was 58 started having seizures; for a while didn't know why; when started HS it was stress and sleep deprivation that triggered seizures; junior year of HS - dpression got really bad; had anxiety that led to seizures.  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; when she is at home and nothing to do - will try to get up and clean and will get out some - sometimes will wake up and slept the day away until [place] like with  all her medicines she still feels the same as if she didn't take it; never misses doses   Nexplanon just got it redone - hasn't had period in almost 2 years   Meal plan: 24 hr recall  [employer] - shared app with sister (cactus blossom, ate 3 rolls), salad, then didn't eat loaded mash potatoes, corn, or country fried chicken; honey bunches of oats with milk; nothing yesterday before [place], nothing this morning   Water intake: 2 or 3 32 oz water bottles; 1 cup coffee/day  Dietitian: yes, but did not like it; knew what she was going into and didn't feel like it was helpful; felt like it was stuff she has heard from family - earlier this year  Therapist: currently - virtual; Lucien Mons - only seen her 3 times; usually once weekly  Medication:  - Compliance:  - Side effects: takes so many meds feels like not sure what makes her feel certain ways - Benefits:  Activity level: not really that active; not motivated to do anything  School: graduated [employer], [place] to get GPA up is a goal for now; was in nursing program, and working and last August had 4 family members pass in a month - couldn't keep it all going  Dental care: pretty recently;  had braces - has crossbite/overbite/crowding - preparing for spacer; first week she broke 3 brackets grinding her teeth;  Sleep: depends - if she wakes up early, and if she does stuff during the day, she will go to sleep around 10-11; other times will see 2-3AM and then fall asleep/watch TV; wakes on her own 12:30; wakes up feeling tired; sludgy  Binge/purge: throws up; not super often but if she eats a lot feels sick and will lie down for a bit; 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 In 30 days she has probably vomited 2-3 times/week.   Menstrual patterns:  Menarche: 13 had regular cycles; 16: Nexplanon; never BTB with it    Pronouns: she/her/hers Female partner, last time 4 months ago  Weight is affecting her relationships  3 weeks of talking with new partner - seeing him this weekend   Family history: family history includes Anxiety disorder in her maternal aunt, maternal grandmother, and mother; Autism in an other family member; Bipolar disorder in her maternal aunt, maternal grandmother, and mother; Depression in her maternal aunt, maternal grandmother, and mother; Hypertension in her father, maternal grandfather, maternal grandmother, mother, paternal grandfather, and paternal grandmother; Lung cancer in her paternal grandfather; Migraines in her maternal aunt, maternal grandfather, maternal grandmother, and mother; Seizures in an other family member.     Review of systems:   Headaches - had one last night  Dizziness - none  Abdominal pain - none  Nausea/vomiting: none, except for purging as above  Dysphagia: voice deeper in mornings, clears throat often; PND  Odonophagia: none Constipation:  Diarrhea:  Tooth decay:  Reflux: yes, takes OTC as needed  Heart palpitations: sometimes with anxiety  Heat/cold intolerance: yes, always been if warm outside, usually colder than others; usually opposite  Skin changes: none  Hair loss: none Mood/anxiety: as above      EAT 26 Completed on 01/16/21 Total Score: 42 Patient report of Weight: Highest: Blank  Lowest: 170 Ideal: 150 Binge: Yes  Purge: Yes  Over-Exercise: No    PHQ-SADS Last 3 Score only 01/18/2021 09/11/2019 07/11/2018  PHQ-15 Score 11 - 6  Total GAD-7 Score 11 - 12  PHQ Adolescent Score 18 0 18    Physical Exam:   General: awake, alert, oriented, comfortable appearing HEENT:  moist mucous membranes, no notable dental erosion, normal parotid gland appearance and size, {no temporal wasting Neck: nonvisible sternocleidomastoids bilaterally, no appreciable thyromegaly   Lymph nodes: no palpable submandibular nodes Heart: regular rhythm,  no murmur appreciated, normal cap refill, hands warm to touch and normal color  Genitalia: deferred Extremities: normal extremities, hands with normal skin and short nails Musculoskeletal: nonvisible cervical spinous processes, no focal weakness Subcutaneous Fat:  Orbital Region: no depletion Upper Arm Region: no depletion  Thoracic and Lumbar Region: no depletion  Muscle:  Temple Region: no depletion Clavicle Bone Region: no depletion Clavicle and Acromion Bone Region: no depletion Patellar Region: no depletion Anterior Thigh Region: no depletion Posterior Calf Region: no depletion Skin: warm dry,  lanugo not present Neurological: cranial nerves II-X intact, finger-to-nose intact bilaterally, grip strength 5/5 and equal, BUE and BLE strength 5/5 and equal, heel-to-shin equal bilaterally, reflexes intact    Assessment/Plan:   1. Anorexia nervosa, binge-eating purging type  -concern for purge behaviors;  she may benefit from Vyvanse for binge-eating behaviors, however because of seizure hx, will avoid stimulants. Will obtain baseline labs today; needs EKG scheduled. Return in 1-2 weeks to review labs and assess medication options. She has seen nutrition, however did not feel it was helpful. She has therapist in place.   - Amylase - CBC With Differential - Comprehensive metabolic panel - Ferritin - IgA - Lipase - Magnesium - Sedimentation rate - Phosphorus - Thyroid Panel With TSH - Tissue transglutaminase, IgA - VITAMIN D 25 Hydroxy (Vit-D Deficiency, Fractures)  2. Adjustment disorder with mixed anxiety and depressed mood -current meds: fluoxetine 40 mg  -Lamictal 300 mg at bedtime  -Topamax 25 mg at bedtime  -hydroxyzine 25 mg at beditme + melatonin   -would benefit from increase from fluoxetine 40 mg to 60 mg as 60 mg dose is associated with improvement in purging behaviors.  -although with  therapy, may benefit from eating disorder trained therapist; will obtain ROI  -because of seizure hx, wellbutrin would not be a good choice, however will continue to assess mood and disordered eating behaviors with increased fluoxetine dose; she may do better with SNRI.   -return in 1-2 weeks to discuss options of increased fluoxetine dose or new agent in SNRI family.    3. Nonintractable juvenile myoclonic epilepsy without status epilepticus ([place]) -managed by Cloretta Ned, NP, Neurology    4. Routine screening for STI (sexually transmitted infection) 5. Screening for genitourinary condition - POCT urinalysis dipstick  6. Pregnancy examination or test, negative result - POCT urine pregnancy    Labs: Initial Visit:  CMP, CBC w/diff, Mg, Ph, Amylase, Lipase, UHCG, UA, ESR, Celiac Panel, Thyroid Panel (consider hormonal studies if menstrual irregularities):  Completed today, Repeat Due PRN Hormonal Studies if menstrual irregularities:  LH, FSH, Estradiol, PRL:  No indication today, Repeat Due PRN EKG: Needs to be done  Bone Density if amenorrheic > 6 months: N/A, Repeat (q 6 months if abnormal) Due N/A  Referrals: Nutrition: declined Counseling: connected  Follow-up:  1-2 weeks

## 2021-01-17 LAB — COMPREHENSIVE METABOLIC PANEL
AG Ratio: 1.6 (calc) (ref 1.0–2.5)
ALT: 32 U/L (ref 5–32)
AST: 19 U/L (ref 12–32)
Albumin: 4.4 g/dL (ref 3.6–5.1)
Alkaline phosphatase (APISO): 99 U/L (ref 36–128)
BUN/Creatinine Ratio: 12 (calc) (ref 6–22)
BUN: 13 mg/dL (ref 7–20)
CO2: 21 mmol/L (ref 20–32)
Calcium: 9.5 mg/dL (ref 8.9–10.4)
Chloride: 108 mmol/L (ref 98–110)
Creat: 1.08 mg/dL — ABNORMAL HIGH (ref 0.50–0.96)
Globulin: 2.8 g/dL (calc) (ref 2.0–3.8)
Glucose, Bld: 81 mg/dL (ref 65–99)
Potassium: 4.3 mmol/L (ref 3.8–5.1)
Sodium: 140 mmol/L (ref 135–146)
Total Bilirubin: 0.4 mg/dL (ref 0.2–1.1)
Total Protein: 7.2 g/dL (ref 6.3–8.2)

## 2021-01-17 LAB — SEDIMENTATION RATE: Sed Rate: 17 mm/h (ref 0–20)

## 2021-01-17 LAB — THYROID PANEL WITH TSH
Free Thyroxine Index: 2.1 (ref 1.4–3.8)
T3 Uptake: 29 % (ref 22–35)
T4, Total: 7.3 ug/dL (ref 5.3–11.7)
TSH: 1.73 mIU/L

## 2021-01-17 LAB — VITAMIN D 25 HYDROXY (VIT D DEFICIENCY, FRACTURES): Vit D, 25-Hydroxy: 28 ng/mL — ABNORMAL LOW (ref 30–100)

## 2021-01-17 LAB — MAGNESIUM: Magnesium: 2.3 mg/dL (ref 1.5–2.5)

## 2021-01-17 LAB — LIPASE: Lipase: 13 U/L (ref 7–60)

## 2021-01-17 LAB — PHOSPHORUS: Phosphorus: 3.6 mg/dL (ref 2.7–5.0)

## 2021-01-17 LAB — IGA: Immunoglobulin A: 210 mg/dL (ref 47–310)

## 2021-01-17 LAB — FERRITIN: Ferritin: 8 ng/mL — ABNORMAL LOW (ref 16–154)

## 2021-01-17 LAB — TISSUE TRANSGLUTAMINASE, IGA: (tTG) Ab, IgA: <1 U/mL

## 2021-01-17 LAB — AMYLASE: Amylase: 32 U/L (ref 21–101)

## 2021-01-18 ENCOUNTER — Encounter: Payer: Self-pay | Admitting: Family

## 2021-01-22 ENCOUNTER — Ambulatory Visit (HOSPITAL_COMMUNITY)
Admission: RE | Admit: 2021-01-22 | Discharge: 2021-01-22 | Disposition: A | Discharge status: Home / Self Care | Payer: Medicaid Other | Source: Ambulatory Visit | Attending: Family | Admitting: Family

## 2021-01-22 DIAGNOSIS — F5002 Anorexia nervosa, binge eating/purging type: Secondary | ICD-10-CM | POA: Insufficient documentation

## 2021-02-03 ENCOUNTER — Encounter: Payer: Self-pay | Admitting: Family

## 2021-02-03 ENCOUNTER — Ambulatory Visit (INDEPENDENT_AMBULATORY_CARE_PROVIDER_SITE_OTHER): Payer: Medicaid Other | Admitting: Family

## 2021-02-03 ENCOUNTER — Other Ambulatory Visit: Payer: Self-pay

## 2021-02-03 VITALS — BP 122/73 | HR 100 | Ht 65.0 in | Wt 310.0 lb

## 2021-02-03 DIAGNOSIS — R790 Abnormal level of blood mineral: Secondary | ICD-10-CM | POA: Diagnosis not present

## 2021-02-03 DIAGNOSIS — J3489 Other specified disorders of nose and nasal sinuses: Secondary | ICD-10-CM | POA: Diagnosis not present

## 2021-02-03 DIAGNOSIS — F5002 Anorexia nervosa, binge eating/purging type: Secondary | ICD-10-CM

## 2021-02-03 DIAGNOSIS — R0981 Nasal congestion: Secondary | ICD-10-CM

## 2021-02-03 MED ORDER — FLUOXETINE HCL 60 MG PO TABS: 60.0000 mg | ORAL_TABLET | Freq: Every day | ORAL | 0 refills | Status: DC

## 2021-02-03 NOTE — Progress Notes (Signed)
History was provided by the patient.  Cynthia [name] is a 20 y.o. female who is here for anorexia nervosa, binge-eating purging type.   PCP confirmed? Yes.    Jackelyn Poling, MD  HPI:   -not necessarily worse, but purged last night  -has BF now, the guy that she was seeing  -has been happier since their relationship  -still seeing therapist  -cold symptoms - nasal congestion and sore throat  -using flonase; learned how to use it at dermatology; not taking consistently   -cough with PND only   PHQ-SADS Last 3 Score only 02/03/2021 01/18/2021 09/11/2019  PHQ-15 Score 6 11 -  Total GAD-7 Score 7 11 -  PHQ Adolescent Score 15 18 0    Patient Active Problem List   Diagnosis Date Noted   Weight gain 11/30/2020   Eating disorder 11/30/2020   History of depression 04/28/2020   History of anxiety 04/28/2020   Intentional drug overdose (HCC)    Severe episode of recurrent major depressive disorder, without psychotic features (HCC) 07/14/2018   Migraine without aura and without status migrainosus, not intractable 05/27/2018   Panic attacks 12/31/2016   Frequent headaches 03/16/2016   Nonintractable juvenile myoclonic epilepsy without status epilepticus (HCC) 04/18/2015    Current Outpatient Medications on File Prior to Visit  Medication Sig Dispense Refill   etonogestrel (NEXPLANON) 68 MG IMPL implant 1 each by Subdermal route once.      FLUoxetine (PROZAC) 40 MG capsule Take 1 capsule per day 30 capsule 5   hydrOXYzine (ATARAX/VISTARIL) 25 MG tablet TAKE 1 TABLET (25 MG TOTAL) BY MOUTH AT BEDTIME AS NEEDED FOR ANXIETY. 30 tablet 2   LamoTRIgine 300 MG TB24 24 hour tablet TAKE 1 TABLET BY MOUTH EVERYDAY AT BEDTIME 30 tablet 5   MELATONIN PO Take 1 tablet by mouth at bedtime as needed (sleep).     topiramate (TOPAMAX) 25 MG tablet Take 1 tablet at bedtime for 1 week, then take 2 tablets at bedtime. 60 tablet 1   Clobetasol Prop Emollient Base (CLOBETASOL PROPIONATE E) 0.05 %  emollient cream BID to affected area on foot (Patient not taking: No sig reported) 30 g 0   ELIDEL 1 % cream QAM to face (Patient not taking: No sig reported) 30 g 1   METROCREAM 0.75 % cream QHS to face (Patient not taking: No sig reported) 45 g 1   No current facility-administered medications on file prior to visit.    Allergies  Allergen Reactions   Tape Dermatitis    When regular hospital tape applied to arm, swelling and redness developed at the site. Where paper tape applied, no reaction noted.     Physical Exam:    Vitals:   02/03/21 1509  BP: 122/73  Pulse: 100  Weight: (!) 310 lb (140.6 kg)  Height: 5\' 5"  (1.651 m)   Wt Readings from Last 3 Encounters:  02/03/21 (!) 310 lb (140.6 kg)  01/16/21 (!) 306 lb (138.8 kg) (>99 %, Z= 2.90)*  11/21/20 (!) 321 lb 6.4 oz (145.8 kg) (>99 %, Z= 2.96)*   * Growth percentiles are based on CDC (Girls, 2-20 Years) data.     Growth percentile SmartLinks can only be used for patients less than 64 years old. No LMP recorded. Patient has had an implant.  Physical Exam Vitals reviewed.  Constitutional:      General: She is not in acute distress. HENT:     Head: Normocephalic.     Nose: Mucosal  edema, congestion and rhinorrhea present.     Mouth/Throat:     Mouth: Mucous membranes are moist.     Dentition: Normal dentition. No dental caries.     Pharynx: Oropharynx is clear. Uvula midline. Posterior oropharyngeal erythema present. No pharyngeal swelling or oropharyngeal exudate.  Eyes:     General: No scleral icterus.    Extraocular Movements: Extraocular movements intact.     Pupils: Pupils are equal, round, and reactive to light.  Neck:     Thyroid: No thyromegaly.  Cardiovascular:     Rate and Rhythm: Normal rate and regular rhythm.     Heart sounds: No murmur heard. Pulmonary:     Effort: Pulmonary effort is normal.  Musculoskeletal:        General: No swelling. Normal range of motion.     Cervical back: Normal range of  motion and neck supple.  Lymphadenopathy:     Cervical: No cervical adenopathy.  Skin:    General: Skin is warm and dry.     Findings: No rash.  Neurological:     General: No focal deficit present.     Mental Status: She is alert.     Assessment/Plan:  Monitoring labs for refeeding syndrome today, although low suspicion for risk. Discussed increasing fluoxetine from 40 mg to 60 mg dose. Reviewed return precautions. Return in 2 weeks or sooner.  Ferritin was low at last check, no CBC resulted; will repeat both today. Discussed nutrition referral. She is agreeable. Continue with therapist.   1. Anorexia nervosa, binge-eating purging type - Magnesium - Phosphorus - Basic metabolic panel  2. Low ferritin - CBC w/Diff/Platelet - Fe+TIBC+Fer  3. Nasal congestion with rhinorrhea -flonase twice daily -continue cetirizine daily

## 2021-02-04 LAB — CBC WITH DIFFERENTIAL/PLATELET
Absolute Monocytes: 691 cells/uL (ref 200–950)
Basophils Absolute: 58 cells/uL (ref 0–200)
Basophils Relative: 0.8 %
Eosinophils Absolute: 180 cells/uL (ref 15–500)
Eosinophils Relative: 2.5 %
HCT: 42.2 % (ref 35.0–45.0)
Hemoglobin: 13.4 g/dL (ref 11.7–15.5)
Lymphs Abs: 2520 cells/uL (ref 850–3900)
MCH: 27.3 pg (ref 27.0–33.0)
MCHC: 31.8 g/dL — ABNORMAL LOW (ref 32.0–36.0)
MCV: 86.1 fL (ref 80.0–100.0)
MPV: 10.4 fL (ref 7.5–12.5)
Monocytes Relative: 9.6 %
Neutro Abs: 3751 cells/uL (ref 1500–7800)
Neutrophils Relative %: 52.1 %
Platelets: 374 10*3/uL (ref 140–400)
RBC: 4.9 10*6/uL (ref 3.80–5.10)
RDW: 13.8 % (ref 11.0–15.0)
Total Lymphocyte: 35 %
WBC: 7.2 10*3/uL (ref 3.8–10.8)

## 2021-02-04 LAB — BASIC METABOLIC PANEL
BUN/Creatinine Ratio: 13 (calc) (ref 6–22)
BUN: 13 mg/dL (ref 7–25)
CO2: 22 mmol/L (ref 20–32)
Calcium: 9.6 mg/dL (ref 8.6–10.2)
Chloride: 108 mmol/L (ref 98–110)
Creat: 0.98 mg/dL — ABNORMAL HIGH (ref 0.50–0.96)
Glucose, Bld: 78 mg/dL (ref 65–99)
Potassium: 4.1 mmol/L (ref 3.5–5.3)
Sodium: 139 mmol/L (ref 135–146)

## 2021-02-04 LAB — IRON,TIBC AND FERRITIN PANEL
%SAT: 11 % (calc) — ABNORMAL LOW (ref 16–45)
Ferritin: 8 ng/mL — ABNORMAL LOW (ref 16–154)
Iron: 40 ug/dL (ref 40–190)
TIBC: 369 mcg/dL (calc) (ref 250–450)

## 2021-02-04 LAB — PHOSPHORUS: Phosphorus: 3.1 mg/dL (ref 2.7–5.0)

## 2021-02-04 LAB — MAGNESIUM: Magnesium: 2.1 mg/dL (ref 1.5–2.5)

## 2021-02-12 ENCOUNTER — Ambulatory Visit: Payer: Medicaid Other | Admitting: Dermatology

## 2021-02-12 ENCOUNTER — Other Ambulatory Visit (INDEPENDENT_AMBULATORY_CARE_PROVIDER_SITE_OTHER): Payer: Self-pay | Admitting: Family

## 2021-02-12 DIAGNOSIS — G43009 Migraine without aura, not intractable, without status migrainosus: Secondary | ICD-10-CM

## 2021-02-26 ENCOUNTER — Encounter: Payer: Self-pay | Admitting: Family

## 2021-02-26 ENCOUNTER — Telehealth: Payer: Medicaid Other | Admitting: Family

## 2021-02-26 DIAGNOSIS — F4323 Adjustment disorder with mixed anxiety and depressed mood: Secondary | ICD-10-CM

## 2021-02-26 DIAGNOSIS — F5002 Anorexia nervosa, binge eating/purging type: Secondary | ICD-10-CM

## 2021-02-26 MED ORDER — FLUOXETINE HCL 60 MG PO TABS: 60.0000 mg | ORAL_TABLET | Freq: Every day | ORAL | 0 refills | Status: DC

## 2021-02-26 NOTE — Progress Notes (Signed)
TC to patient. She double-booked appts today and needs to reschedule.  Stable and doing well; needs Fluoxetine 40 mg refill sent.  Can see me Tueday 12/6 at 1130 AM video.

## 2021-03-04 ENCOUNTER — Telehealth: Payer: Medicaid Other | Admitting: Family

## 2021-03-12 ENCOUNTER — Encounter: Payer: Self-pay | Admitting: Family

## 2021-03-12 ENCOUNTER — Other Ambulatory Visit: Payer: Self-pay

## 2021-03-12 ENCOUNTER — Telehealth: Payer: Medicaid Other | Admitting: Family

## 2021-03-12 DIAGNOSIS — R0781 Pleurodynia: Secondary | ICD-10-CM | POA: Diagnosis not present

## 2021-03-12 DIAGNOSIS — Z03818 Encounter for observation for suspected exposure to other biological agents ruled out: Secondary | ICD-10-CM | POA: Diagnosis not present

## 2021-03-12 DIAGNOSIS — R1031 Right lower quadrant pain: Secondary | ICD-10-CM | POA: Diagnosis not present

## 2021-03-12 DIAGNOSIS — R1030 Lower abdominal pain, unspecified: Secondary | ICD-10-CM | POA: Diagnosis not present

## 2021-03-12 DIAGNOSIS — R112 Nausea with vomiting, unspecified: Secondary | ICD-10-CM | POA: Diagnosis not present

## 2021-03-12 DIAGNOSIS — R1032 Left lower quadrant pain: Secondary | ICD-10-CM | POA: Diagnosis not present

## 2021-03-12 DIAGNOSIS — R051 Acute cough: Secondary | ICD-10-CM | POA: Diagnosis not present

## 2021-03-12 DIAGNOSIS — R059 Cough, unspecified: Secondary | ICD-10-CM | POA: Diagnosis not present

## 2021-03-12 DIAGNOSIS — F4323 Adjustment disorder with mixed anxiety and depressed mood: Secondary | ICD-10-CM

## 2021-03-12 DIAGNOSIS — F5002 Anorexia nervosa, binge eating/purging type: Secondary | ICD-10-CM

## 2021-03-12 DIAGNOSIS — R350 Frequency of micturition: Secondary | ICD-10-CM | POA: Diagnosis not present

## 2021-03-12 DIAGNOSIS — R079 Chest pain, unspecified: Secondary | ICD-10-CM | POA: Diagnosis not present

## 2021-03-12 NOTE — Progress Notes (Signed)
Patient not available for video visit.  Closed for admin purposes.  

## 2021-03-13 ENCOUNTER — Other Ambulatory Visit (INDEPENDENT_AMBULATORY_CARE_PROVIDER_SITE_OTHER): Payer: Self-pay | Admitting: Family

## 2021-03-13 ENCOUNTER — Other Ambulatory Visit: Payer: Self-pay

## 2021-03-13 ENCOUNTER — Other Ambulatory Visit
Admission: RE | Admit: 2021-03-13 | Discharge: 2021-03-13 | Disposition: A | Discharge status: Home / Self Care | Payer: Medicaid Other | Source: Ambulatory Visit | Attending: Student | Admitting: Student

## 2021-03-13 DIAGNOSIS — Z8659 Personal history of other mental and behavioral disorders: Secondary | ICD-10-CM

## 2021-03-13 DIAGNOSIS — R0781 Pleurodynia: Secondary | ICD-10-CM | POA: Diagnosis present

## 2021-03-13 DIAGNOSIS — L219 Seborrheic dermatitis, unspecified: Secondary | ICD-10-CM

## 2021-03-13 DIAGNOSIS — F410 Panic disorder [episodic paroxysmal anxiety] without agoraphobia: Secondary | ICD-10-CM

## 2021-03-13 DIAGNOSIS — K219 Gastro-esophageal reflux disease without esophagitis: Secondary | ICD-10-CM

## 2021-03-13 LAB — D-DIMER, QUANTITATIVE: D-Dimer, Quant: 0.44 ug/mL-FEU (ref 0.00–0.50)

## 2021-03-13 MED ORDER — METROCREAM 0.75 % EX CREA: TOPICAL_CREAM | CUTANEOUS | 1 refills | Status: DC

## 2021-03-17 ENCOUNTER — Ambulatory Visit: Payer: Medicaid Other | Admitting: Family

## 2021-03-17 ENCOUNTER — Other Ambulatory Visit: Payer: Self-pay

## 2021-03-17 DIAGNOSIS — L219 Seborrheic dermatitis, unspecified: Secondary | ICD-10-CM

## 2021-03-17 MED ORDER — ELIDEL 1 % EX CREA: TOPICAL_CREAM | CUTANEOUS | 1 refills | Status: DC

## 2021-04-02 ENCOUNTER — Other Ambulatory Visit (INDEPENDENT_AMBULATORY_CARE_PROVIDER_SITE_OTHER): Payer: Self-pay | Admitting: Family

## 2021-04-02 DIAGNOSIS — K219 Gastro-esophageal reflux disease without esophagitis: Secondary | ICD-10-CM

## 2021-04-02 DIAGNOSIS — F410 Panic disorder [episodic paroxysmal anxiety] without agoraphobia: Secondary | ICD-10-CM

## 2021-04-02 DIAGNOSIS — Z8659 Personal history of other mental and behavioral disorders: Secondary | ICD-10-CM

## 2021-04-04 ENCOUNTER — Other Ambulatory Visit: Payer: Self-pay

## 2021-04-04 DIAGNOSIS — L219 Seborrheic dermatitis, unspecified: Secondary | ICD-10-CM

## 2021-04-04 MED ORDER — METROCREAM 0.75 % EX CREA: TOPICAL_CREAM | CUTANEOUS | 1 refills | Status: DC

## 2021-04-04 NOTE — Progress Notes (Signed)
Fax received from [facility] for [place] for Metronidazole 0.75% cream. New prescription sent marked DAW for Metrocream 0.75% cream which is covered by patient's insurance.

## 2021-04-05 ENCOUNTER — Other Ambulatory Visit (INDEPENDENT_AMBULATORY_CARE_PROVIDER_SITE_OTHER): Payer: Self-pay | Admitting: Family

## 2021-04-05 DIAGNOSIS — G43009 Migraine without aura, not intractable, without status migrainosus: Secondary | ICD-10-CM

## 2021-04-05 DIAGNOSIS — F410 Panic disorder [episodic paroxysmal anxiety] without agoraphobia: Secondary | ICD-10-CM

## 2021-04-27 ENCOUNTER — Encounter (INDEPENDENT_AMBULATORY_CARE_PROVIDER_SITE_OTHER): Payer: Self-pay

## 2021-04-27 DIAGNOSIS — G40B09 Juvenile myoclonic epilepsy, not intractable, without status epilepticus: Secondary | ICD-10-CM

## 2021-04-28 MED ORDER — LAMOTRIGINE ER 300 MG PO TB24: ORAL_TABLET | ORAL | 0 refills | Status: DC

## 2021-04-29 ENCOUNTER — Other Ambulatory Visit (INDEPENDENT_AMBULATORY_CARE_PROVIDER_SITE_OTHER): Payer: Self-pay | Admitting: Family

## 2021-04-29 DIAGNOSIS — K219 Gastro-esophageal reflux disease without esophagitis: Secondary | ICD-10-CM

## 2021-05-05 ENCOUNTER — Ambulatory Visit (INDEPENDENT_AMBULATORY_CARE_PROVIDER_SITE_OTHER): Payer: Medicaid Other | Admitting: Family

## 2021-05-05 ENCOUNTER — Encounter (INDEPENDENT_AMBULATORY_CARE_PROVIDER_SITE_OTHER): Payer: Self-pay | Admitting: Family

## 2021-05-05 ENCOUNTER — Other Ambulatory Visit: Payer: Self-pay

## 2021-05-05 VITALS — BP 122/74 | Wt 309.0 lb

## 2021-05-05 DIAGNOSIS — G40B09 Juvenile myoclonic epilepsy, not intractable, without status epilepticus: Secondary | ICD-10-CM | POA: Diagnosis not present

## 2021-05-05 DIAGNOSIS — F410 Panic disorder [episodic paroxysmal anxiety] without agoraphobia: Secondary | ICD-10-CM | POA: Diagnosis not present

## 2021-05-05 DIAGNOSIS — R300 Dysuria: Secondary | ICD-10-CM

## 2021-05-05 DIAGNOSIS — G43009 Migraine without aura, not intractable, without status migrainosus: Secondary | ICD-10-CM | POA: Diagnosis not present

## 2021-05-05 DIAGNOSIS — Z8659 Personal history of other mental and behavioral disorders: Secondary | ICD-10-CM

## 2021-05-05 DIAGNOSIS — K219 Gastro-esophageal reflux disease without esophagitis: Secondary | ICD-10-CM

## 2021-05-05 DIAGNOSIS — F5002 Anorexia nervosa, binge eating/purging type: Secondary | ICD-10-CM

## 2021-05-05 MED ORDER — PANTOPRAZOLE SODIUM 40 MG PO TBEC: 40.0000 mg | DELAYED_RELEASE_TABLET | Freq: Every day | ORAL | 5 refills | Status: DC

## 2021-05-05 MED ORDER — TOPIRAMATE 50 MG PO TABS: ORAL_TABLET | ORAL | 5 refills | Status: DC

## 2021-05-05 MED ORDER — LAMOTRIGINE ER 300 MG PO TB24: ORAL_TABLET | ORAL | 5 refills | Status: DC

## 2021-05-05 MED ORDER — HYDROXYZINE HCL 25 MG PO TABS: ORAL_TABLET | ORAL | 0 refills | Status: DC

## 2021-05-05 MED ORDER — SULFAMETHOXAZOLE-TRIMETHOPRIM 800-160 MG PO TABS: 1.0000 | ORAL_TABLET | Freq: Two times a day (BID) | ORAL | 0 refills | Status: AC

## 2021-05-05 NOTE — Progress Notes (Signed)
Cynthia [name]   MRN:  [id number]  [birth year]   Provider: [name] NP-C Location of Care: [facility]  Visit type: Return visit  Last visit: 11/21/2020  Referral source: Jonetta Speak, MD History from: Epic chart and patient  Brief history:  Copied from previous record: History of primary generalized epilepsy, migraines, panic and anxiety. She is taking Lamotrigine ER and Clonazepam for seizures, and Fluoxetine and Hydroxyzine for her mood disorder. Her last seizure occurred 01/14/20 in the setting of sleep deprivation. She has been seen by [facility] for anxiety and panic, which is typically triggered by disagreements with her parents. She also has some anxiety in crowds. She is taking and tolerating Topiramate for migraine prevention. She has been hospitalized for overdose attempts with the most recent being in October 2020. She is being followed by Adolescent Medicine for eating disorder.  Today's concerns: Cynthia [name] reports today that she has remained seizure free since her last visit. Migraines have not been problematic. She has been under considerable stress due to disagreements with her family. She is now living with her boyfriend and his grandmother. 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   Cynthia [name] also has questions today about urinary symptoms that she experiencing for the past few days. She complains of urgency, frequency and burning with urination.   Cynthia [name] has been otherwise generally healthy since she was last seen. She has no other health concerns today other than previously mentioned.  Review of systems: Please see HPI for neurologic and other pertinent review of systems. Otherwise all other systems were reviewed and were negative.  Problem List: Patient Active Problem List   Diagnosis Date Noted   Weight gain 11/30/2020   Eating disorder 11/30/2020   History of depression 04/28/2020    History of anxiety 04/28/2020   Intentional drug overdose (HCC)    Severe episode of recurrent major depressive disorder, without psychotic features (HCC) 07/14/2018   Migraine without aura and without status migrainosus, not intractable 05/27/2018   Panic attacks 12/31/2016   Frequent headaches 03/16/2016   Nonintractable juvenile myoclonic epilepsy without status epilepticus (HCC) 04/18/2015     Past Medical History:  Diagnosis Date   Anxiety    Phreesia 09/11/2019   Depression    Depression    Phreesia 04/25/2020   Headache    Intentional SSRI (selective serotonin reuptake inhibitor) overdose, initial encounter (HCC) 01/10/2019   Overdose of benzodiazepine, intentional self-harm, initial encounter (HCC) 01/10/2019   Seizure (HCC)    Seizures (HCC)    Phreesia 09/11/2019   Vision abnormalities     Past medical history comments: See HPI Copied from previous record: She took Keppra in the past but was changed to Trokendi XR in 2017 because of ongoing seizures and for migraine prevention. An EEG performed January 08, 2017 was normal. 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 An EEG done on February 07, 2018 was normal awake and asleep. Keppra was restarted in 2019 because of seizure activity but stopped due to side effects   Surgical history: Past Surgical History:  Procedure Laterality Date   TONSILLECTOMY Bilateral 2007   Performed at [facility]   TYMPANOSTOMY TUBE PLACEMENT Bilateral 2004   Performed at [facility]    Family history: family history includes Anxiety disorder in her maternal aunt, maternal grandmother, and mother; Autism in an other family member; Bipolar disorder in her maternal aunt, maternal grandmother, and  mother; Depression in her maternal aunt, maternal grandmother, and mother; Hypertension in her father, maternal grandfather, maternal grandmother, mother, paternal grandfather, and paternal grandmother; Lung cancer in her  paternal grandfather; Migraines in her maternal aunt, maternal grandfather, maternal grandmother, and mother; Seizures in an other family member.   Social history: Social History   Socioeconomic History   Marital status: Single    Spouse name: Not on file   Number of children: Not on file   Years of education: Not on file   Highest education level: Not on file  Occupational History   Not on file  Tobacco Use   Smoking status: Smoker, Current Status Unknown    Types: E-cigarettes   Smokeless tobacco: Never  Vaping Use   Vaping Use: Never used  Substance and Sexual Activity   Alcohol use: Yes   Drug use: Yes    Types: Marijuana   Sexual activity: Yes    Birth control/protection: Implant  Other Topics Concern   Not on file  Social History Narrative   Cynthia [name] is 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      Social Determinants of Health   Financial Resource Strain: Not on file  Food Insecurity: Not on file  Transportation Needs: Not on file  Physical Activity: Not on file  Stress: Not on file  Social Connections: Not on file  Intimate Partner Violence: Not on file    Past/failed meds: Copied from previous record: Keppra - side effects Trokendi XR - improved headaches, later changed to immediate release Topiramate Lamotrigine - side effects - has tolerated Lamotrigine ER well  Allergies: Allergies  Allergen Reactions   Tape Dermatitis    When regular hospital tape applied to arm, swelling and redness developed at the site. Where paper tape applied, no reaction noted.     Immunizations: Immunization History  Administered Date(s) Administered   Influenza,inj,Quad PF,6+ Mos 01/12/2019   Moderna Sars-Covid-2 Vaccination 02/04/2020   Pfizer Covid-19 Vaccine Bivalent Booster 2237yrs & up 04/29/2021    Diagnostics/Screenings: Copied from previous  record: rEEG 02/07/18 - normal awake and asleep. Regino Schultze. Nabizadeh, MD   rEEG 02/19/16 - abnormal with the patient awake, drowsy and asleep. The presence of generalized spike and wave discharge is consistent with her primary generalized epilepsy and raises the risk of recurrent seizures. Regino Schultze. Nabizadeh, MD   Physical Exam: BP 122/74    Wt (!) 309 lb (140.2 kg)    BMI 51.42 kg/m   Wt Readings from Last 3 Encounters:  05/05/21 (!) 309 lb (140.2 kg)  02/03/21 (!) 310 lb (140.6 kg)  01/16/21 (!) 306 lb (138.8 kg) (>99 %, Z= 2.90)*   * Growth percentiles are based on CDC (Girls, 2-20 Years) data.    General: Obese, well developed, well nourished young woman, seated on exam table, in no evident distress Head: Head normocephalic and atraumatic.  Oropharynx benign. Neck: Supple Cardiovascular: Regular rate and rhythm, no murmurs Respiratory: Breath sounds clear to auscultation Musculoskeletal: No obvious deformities or scoliosis Skin: No rashes or neurocutaneous lesions  Neurologic Exam Mental Status: Awake and fully alert.  Oriented to place and time.  Recent and remote memory intact.  Attention span, concentration, and fund of knowledge appropriate.  Mood and affect appropriate. Cranial Nerves: Fundoscopic exam reveals sharp disc margins.  Pupils equal, briskly reactive to light.  Extraocular movements full without  nystagmus. Hearing intact and symmetric to whisper.  Facial sensation intact.  Face tongue, palate move normally and symmetrically. Shoulder shrug normal Motor: Normal bulk and tone. Normal strength in all tested extremity muscles. Sensory: Intact to touch and temperature in all extremities.  Coordination: Rapid alternating movements normal in all extremities.  Finger-to-nose and heel-to shin performed accurately bilaterally.  Romberg negative. Gait and Station: Arises from chair without difficulty.  Stance is normal. Gait demonstrates normal stride length and balance.   Able to heel, toe  and tandem walk without difficulty. Reflexes: 1+ and symmetric. Toes downgoing.   Impression: Nonintractable juvenile myoclonic epilepsy without status epilepticus (HCC) - Plan: LamoTRIgine 300 MG TB24 24 hour tablet  Burning with urination - Plan: sulfamethoxazole-trimethoprim (BACTRIM DS) 800-160 MG tablet  Panic attacks - Plan: hydrOXYzine (ATARAX) 25 MG tablet  Gastroesophageal reflux disease, unspecified whether esophagitis present - Plan: pantoprazole (PROTONIX) 40 MG tablet  Migraine without aura and without status migrainosus, not intractable - Plan: topiramate (TOPAMAX) 50 MG tablet  Anorexia nervosa, binge-eating purging type  History of depression  History of anxiety   Recommendations for plan of care: The patient's previous [facility] records were reviewed. Kiernan has neither had nor required imaging or lab studies since the last visit. She is a 21 year old young woman with history of juvenile myoclonic epilepsy, migraine headaches, anxiety and panic, GERD, and eating disorder. She also reports symptoms of urgency, frequency and burning with urination for the past few days. She is taking and tolerating Lamotrigine ER for epilepsy, Topiramate for migraines, Fluoxetine and Hydroxyzine for mood disorder and Pantoprazole for GERD. I encouraged Arijana to continue close follow up with Adolescent Medicine for the eating disorder. I reminded Dawna of the need for her to avoid skipping meals, to drink plenty of water each day and to get at least 8 hours of sleep each night as these things are known to reduce how often headaches occur.  For her urinary symptoms, I talked with her about appropriate hygiene measures and particularly after sexual intercourse. I agreed to prescribe Bactrim for the presumed urinary tract infection because of the time of her appointment being late in the day and her only recourse otherwise would be to go to Urgent Care or ED. I strongly cautioned her about potential  allergy and side effect symptoms, and advised her to follow up with her PCP. I will otherwise see her back in follow up in 6 months or sooner if needed. Alaija agreed with these plans.   The medication list was reviewed and reconciled. I reviewed changes that were made in the prescribed medications today. A complete medication list was provided to the patient.  Return in about 6 months (around 11/02/2021).   Allergies as of 05/05/2021       Reactions   Tape Dermatitis   When regular hospital tape applied to arm, swelling and redness developed at the site. Where paper tape applied, no reaction noted.         Medication List        Accurate as of May 05, 2021 11:59 PM. If you have any questions, ask your nurse or doctor.          Clobetasol Prop Emollient Base 0.05 % emollient cream Commonly known as: Clobetasol Propionate E BID to affected area on foot   Elidel 1 % cream Generic drug: pimecrolimus QAM to face   FLUoxetine HCl 60 MG Tabs Take 60 mg by mouth daily.   hydrOXYzine 25  MG tablet Commonly known as: ATARAX TAKE 1 TABLET BY MOUTH EVERY DAY AT BEDTIME AS NEEDED FOR ANXIETY   LamoTRIgine 300 MG Tb24 24 hour tablet TAKE 1 TABLET BY MOUTH EVERYDAY AT BEDTIME   MELATONIN PO Take 1 tablet by mouth at bedtime as needed (sleep).   MetroCream 0.75 % cream Generic drug: metroNIDAZOLE QHS to face   Nexplanon 68 MG Impl implant Generic drug: etonogestrel 1 each by Subdermal route once.   pantoprazole 40 MG tablet Commonly known as: PROTONIX Take 1 tablet (40 mg total) by mouth daily.   sulfamethoxazole-trimethoprim 800-160 MG tablet Commonly known as: BACTRIM DS Take 1 tablet by mouth 2 (two) times daily for 5 days. Started by: [name], NP   topiramate 50 MG tablet Commonly known as: Topamax Take 1 tablet at bedtime What changed:  medication strength See the new instructions. Changed by: [name], NP      Total time spent with the  patient was 30 minutes, of which 50% or more was spent in counseling and coordination of care.  [name] NP-C [facility] Ph. [phone] Fax [phone]

## 2021-05-05 NOTE — Patient Instructions (Addendum)
It was a pleasure to see you today!  Instructions for you until your next appointment are as follows: Continue taking your medications as prescribed. I changed the Topiramate to a 50mg  tablet so that you will only take 1 at night I sent in a prescription for Bactrim for UTI. Take 1 tablet twice per day for 5 days. If the symptoms return, see your PCP or OB/GYN.  Watch for rash when you start Bactrim. If it occurs stop the medication.  Please sign up for MyChart if you have not done so. Please plan to return for follow up in 6 months or sooner if needed.    Feel free to contact our office during normal business hours at [phone] with questions or concerns. If there is no answer or the call is outside business hours, please leave a message and our clinic staff will call you back within the next business day.  If you have an urgent concern, please stay on the line for our after-hours answering service and ask for the on-call neurologist.     I also encourage you to use MyChart to communicate with me more directly. If you have not yet signed up for MyChart within [facility], the front desk staff can help you. However, please note that this inbox is NOT monitored on nights or weekends, and response can take up to 2 business days.  Urgent matters should be discussed with the on-call pediatric neurologist.   At [facility], we are committed to providing exceptional care. You will receive a patient satisfaction survey through text or email regarding your visit today. Your opinion is important to me. Comments are appreciated.

## 2021-05-06 ENCOUNTER — Other Ambulatory Visit (INDEPENDENT_AMBULATORY_CARE_PROVIDER_SITE_OTHER): Payer: Self-pay | Admitting: Family

## 2021-05-06 DIAGNOSIS — G43009 Migraine without aura, not intractable, without status migrainosus: Secondary | ICD-10-CM

## 2021-05-11 ENCOUNTER — Encounter (INDEPENDENT_AMBULATORY_CARE_PROVIDER_SITE_OTHER): Payer: Self-pay | Admitting: Family

## 2021-05-11 DIAGNOSIS — K219 Gastro-esophageal reflux disease without esophagitis: Secondary | ICD-10-CM | POA: Insufficient documentation

## 2021-06-01 ENCOUNTER — Other Ambulatory Visit: Payer: Self-pay | Admitting: Family

## 2021-06-22 ENCOUNTER — Other Ambulatory Visit: Payer: Self-pay | Admitting: Family

## 2021-06-23 ENCOUNTER — Other Ambulatory Visit: Payer: Self-pay | Admitting: Family

## 2021-06-24 ENCOUNTER — Other Ambulatory Visit: Payer: Self-pay | Admitting: Pediatrics

## 2021-06-24 MED ORDER — FLUOXETINE HCL 20 MG PO CAPS: 60.0000 mg | ORAL_CAPSULE | Freq: Every day | ORAL | 0 refills | Status: DC

## 2021-06-28 ENCOUNTER — Other Ambulatory Visit (INDEPENDENT_AMBULATORY_CARE_PROVIDER_SITE_OTHER): Payer: Self-pay | Admitting: Family

## 2021-06-28 DIAGNOSIS — G40B09 Juvenile myoclonic epilepsy, not intractable, without status epilepticus: Secondary | ICD-10-CM

## 2021-07-01 ENCOUNTER — Ambulatory Visit (INDEPENDENT_AMBULATORY_CARE_PROVIDER_SITE_OTHER): Payer: Medicaid Other | Admitting: Family

## 2021-07-01 ENCOUNTER — Encounter (INDEPENDENT_AMBULATORY_CARE_PROVIDER_SITE_OTHER): Payer: Self-pay | Admitting: Family

## 2021-07-01 ENCOUNTER — Encounter (INDEPENDENT_AMBULATORY_CARE_PROVIDER_SITE_OTHER): Payer: Self-pay

## 2021-07-01 VITALS — HR 97 | Wt 312.8 lb

## 2021-07-01 DIAGNOSIS — M25552 Pain in left hip: Secondary | ICD-10-CM

## 2021-07-01 DIAGNOSIS — R635 Abnormal weight gain: Secondary | ICD-10-CM

## 2021-07-01 DIAGNOSIS — Z8659 Personal history of other mental and behavioral disorders: Secondary | ICD-10-CM

## 2021-07-01 DIAGNOSIS — G40B09 Juvenile myoclonic epilepsy, not intractable, without status epilepticus: Secondary | ICD-10-CM

## 2021-07-01 DIAGNOSIS — F0630 Mood disorder due to known physiological condition, unspecified: Secondary | ICD-10-CM | POA: Diagnosis not present

## 2021-07-01 DIAGNOSIS — G43009 Migraine without aura, not intractable, without status migrainosus: Secondary | ICD-10-CM

## 2021-07-01 NOTE — Patient Instructions (Signed)
It was a pleasure to see you today! ? ?Instructions for you until your next appointment are as follows: ?I have ordered an x-ray of your hip. This can be done at [facility] at the [facility]. I will call you when I receive the report.  ?Be sure to follow up with Adolescent Medicine ?Please sign up for MyChart if you have not done so. ?Please plan to return for follow up in August as previously planned or sooner if needed. ? ?  ?Feel free to contact our office during normal business hours at [phone] with questions or concerns. If there is no answer or the call is outside business hours, please leave a message and our clinic staff will call you back within the next business day.  If you have an urgent concern, please stay on the line for our after-hours answering service and ask for the on-call neurologist.   ?  ?I also encourage you to use MyChart to communicate with me more directly. If you have not yet signed up for MyChart within [facility], the front desk staff can help you. However, please note that this inbox is NOT monitored on nights or weekends, and response can take up to 2 business days.  Urgent matters should be discussed with the on-call pediatric neurologist.  ? ?At [facility], we are committed to providing exceptional care. You will receive a patient satisfaction survey through text or email regarding your visit today. Your opinion is important to me. Comments are appreciated.   ?

## 2021-07-01 NOTE — Progress Notes (Signed)
? ?Dene Landsberg Fann   ?MRN:  [id number]  ?10-22-2000  ? ?Provider: [name] NP-C ?Location of Care: [facility] ? ?Visit type: Urgent return visit ? ?Last visit: 05/05/2021 ? ?Referral source: Jonetta Speak, MD ?History from: Epic chart and the patient ? ?Brief history:  ?Copied from previous record: ?History of primary generalized epilepsy, migraines, panic and anxiety. She is taking Lamotrigine ER and Clonazepam for seizures, and Fluoxetine and Hydroxyzine for her mood disorder. Her last seizure occurred 01/14/20 in the setting of sleep deprivation. She has been seen by [facility] for anxiety and panic, which is typically triggered by disagreements with her parents. She also has some anxiety in crowds. She is taking and tolerating Topiramate for migraine prevention. She has been hospitalized for overdose attempts with the most recent being in October 2020. She is being followed by Adolescent Medicine for eating disorder. ? ?Today's concerns: ?Yoneko is seen on urgent basis today because she contacted to request the visit to discuss some concerns. She reports today that she has been in angry mood and "just wants to be left alone" for about the last month. She denies feeling down or depressed, and says that she is "in a bad mood".  Kamile lives with her boyfriend and his grandmother, and says that the grandmother doesn't like her. She also notes that the grandmother spends her free time drinking, and doesn't contribute to the chores and work around the house, but expects her to do so. Taylie reports that she does not have a good relationship with her parents at this time because they are "addicted to drugs" and that she doesn't care for their lifestyle. Latori says that she has occasional alcohol but doesn't like the taste. She has tried marijuana but didn't like the effects of the drug. She admits to frequent vaping.  Karine wonders if she has inherited addictive  tendencies from her parents.  ? ?Yvaine also reports pain in her left hip that occurs with walking, standing, sitting and lying down. She cannot recall any trauma to the hip. She says that sometimes her left foot feels numb when the pain is severe. Naidelyn has also noted intermittent sharp stabbing pain in her left lower abdomen but says that it is different from the hip pain. She missed work yesterday and today because of the hip pain. 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  ? ?Lianne reports that she is also frustrated that she has not lost weight. She says that she has been going to a gym sometimes but that she hasn't been able to change her eating habits. Icey says that she works 3pm-11pm and that her usual routine is that she does not eat prior to going to work, and at work that she has snack foods such as a granola bar, but consume a regular meal. She says that around 1AM she and her boyfriend (who also works 2nd shift), cooks a meal together and has dinner before going to bed around 2:00-2:30AM. She sleeps until about 12N the following day, and then spends her time before going to work packing dinner for her boyfriend and getting herself ready to start her shift. Navayah is interested in weight loss surgery but says that when she looked into it, there were concerns about her history of depression and that she had to go through a waiting period of  at least a year to be considered.  ? ?Surie has been otherwise generally healthy since she was last seen. She has no other health concerns today other than previously mentioned. ? ?Review of systems: ?Please see HPI for neurologic and other pertinent review of systems. Otherwise all other systems were reviewed and were negative. ? ?Problem List: ?Patient Active Problem List  ? Diagnosis Date Noted  ? Gastroesophageal reflux disease 05/11/2021  ? Weight  gain 11/30/2020  ? Eating disorder 11/30/2020  ? History of depression 04/28/2020  ? History of anxiety 04/28/2020  ? Intentional drug overdose (HCC)   ? Severe episode of recurrent major depressive disorder, without psychotic features (HCC) 07/14/2018  ? Migraine without aura and without status migrainosus, not intractable 05/27/2018  ? Panic attacks 12/31/2016  ? Frequent headaches 03/16/2016  ? Nonintractable juvenile myoclonic epilepsy without status epilepticus (HCC) 04/18/2015  ?  ? ?Past Medical History:  ?Diagnosis Date  ? Anxiety   ? Phreesia 09/11/2019  ? Depression   ? Depression   ? Phreesia 04/25/2020  ? Headache   ? Intentional SSRI (selective serotonin reuptake inhibitor) overdose, initial encounter (HCC) 01/10/2019  ? Overdose of benzodiazepine, intentional self-harm, initial encounter (HCC) 01/10/2019  ? Seizure (HCC)   ? Seizures (HCC)   ? Phreesia 09/11/2019  ? Vision abnormalities   ?  ?Past medical history comments: See HPI ?Copied from previous record: ?She took Keppra in the past but was changed to Trokendi XR in 2017 because of ongoing seizures and for migraine prevention. An EEG performed January 08, 2017 was normal. 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 An EEG done on February 07, 2018 was normal awake and asleep. Keppra was restarted in 2019 because of seizure activity but stopped due to side effects ? ?Surgical history: ?Past Surgical History:  ?Procedure Laterality Date  ? TONSILLECTOMY Bilateral 2007  ? Performed at [facility]  ? TYMPANOSTOMY TUBE PLACEMENT Bilateral 2004  ? Performed at [facility]  ?  ? ?Family history: ?family history includes Anxiety disorder in her maternal aunt, maternal grandmother, and mother; Autism in an other family member; Bipolar disorder in her maternal aunt, maternal grandmother, and mother; Depression in her maternal aunt, maternal grandmother, and mother; Hypertension in her father, maternal grandfather, maternal  grandmother, mother, paternal grandfather, and paternal grandmother; Lung cancer in her paternal grandfather; Migraines in her maternal aunt, maternal grandfather, maternal grandmother, and mother; Seizures in an other family member.  ? ?Social history: ?Social History  ? ?Socioeconomic History  ? Marital status: Single  ?  Spouse name: Not on file  ? Number of children: Not on file  ? Years of education: Not on file  ? Highest education level: Not on file  ?Occupational History  ? Not on file  ?Tobacco Use  ? Smoking status: Smoker, Current Status Unknown  ?  Types: E-cigarettes  ? Smokeless tobacco: Never  ?Vaping Use  ? Vaping Use: Never used  ?Substance and Sexual Activity  ? Alcohol use: Yes  ? Drug use: Yes  ?  Types: Marijuana  ? Sexual activity: Yes  ?  Birth control/protection: Implant  ?Other Topics Concern  ? Not on file  ?Social History Narrative  ? 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   ? 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 Lives with her boyfriend and his grandmother  ?   ? ?Social Determinants of Health  ? ?Financial Resource Strain: Not on file  ?  Food Insecurity: Not on file  ?Transportation Needs: Not on file  ?Physical Activity: Not on file  ?Stress: Not on file  ?Social Connections: Not on file  ?Intimate Partner Violence: Not on file  ?  ?Past/failed meds: ?Copied from previous record: ?Keppra - side effects ?Trokendi XR - improved headaches, later changed to immediate release Topiramate ?Lamotrigine - side effects - has tolerated Lamotrigine ER well ?  ?Allergies: ?Allergies  ?Allergen Reactions  ? Tape Dermatitis  ?  When regular hospital tape applied to arm, swelling and redness developed at the site. Where paper tape applied, no reaction noted.   ?  ?Immunizations: ?Immunization History  ?Administered Date(s) Administered  ? Influenza,inj,Quad PF,6+ Mos 01/12/2019  ? Moderna Sars-Covid-2 Vaccination 02/04/2020  ? Research officer, trade unionfizer Covid-19 Vaccine Bivalent Booster 5589yrs & up 04/29/2021  ?   ?Diagnostics/Screenings: ?Copied from previous record: ?rEEG 02/07/18 - normal awake and asleep. Regino Schultze. Nabizadeh, MD ?  ?rEEG 02/19/16 - abnormal with the patient awake, drowsy and asleep. The presence of gener

## 2021-07-02 MED ORDER — TOPIRAMATE 50 MG PO TABS: ORAL_TABLET | ORAL | 5 refills | Status: DC

## 2021-07-02 MED ORDER — LAMOTRIGINE ER 300 MG PO TB24: ORAL_TABLET | ORAL | 1 refills | Status: DC

## 2021-07-10 ENCOUNTER — Encounter (INDEPENDENT_AMBULATORY_CARE_PROVIDER_SITE_OTHER): Payer: Self-pay

## 2021-07-10 DIAGNOSIS — M25552 Pain in left hip: Secondary | ICD-10-CM

## 2021-07-17 NOTE — Addendum Note (Signed)
Addended by: Princella Ion on: 07/17/2021 06:57 AM ? ? Modules accepted: Orders ? ?

## 2021-07-22 DIAGNOSIS — M5416 Radiculopathy, lumbar region: Secondary | ICD-10-CM | POA: Diagnosis not present

## 2021-08-02 ENCOUNTER — Other Ambulatory Visit (INDEPENDENT_AMBULATORY_CARE_PROVIDER_SITE_OTHER): Payer: Self-pay | Admitting: Family

## 2021-08-02 DIAGNOSIS — F410 Panic disorder [episodic paroxysmal anxiety] without agoraphobia: Secondary | ICD-10-CM

## 2021-08-06 ENCOUNTER — Encounter (INDEPENDENT_AMBULATORY_CARE_PROVIDER_SITE_OTHER): Payer: Self-pay

## 2021-08-06 DIAGNOSIS — J302 Other seasonal allergic rhinitis: Secondary | ICD-10-CM

## 2021-08-06 MED ORDER — CETIRIZINE HCL 10 MG PO TABS: 10.0000 mg | ORAL_TABLET | Freq: Every day | ORAL | 1 refills | Status: DC

## 2021-08-14 ENCOUNTER — Other Ambulatory Visit: Payer: Self-pay

## 2021-08-14 ENCOUNTER — Encounter (HOSPITAL_BASED_OUTPATIENT_CLINIC_OR_DEPARTMENT_OTHER): Payer: Self-pay | Admitting: *Deleted

## 2021-08-14 ENCOUNTER — Emergency Department (HOSPITAL_BASED_OUTPATIENT_CLINIC_OR_DEPARTMENT_OTHER)
Admission: EM | Admit: 2021-08-14 | Discharge: 2021-08-14 | Disposition: A | Discharge status: Home / Self Care | Payer: Medicaid Other | Attending: Emergency Medicine | Admitting: Emergency Medicine

## 2021-08-14 ENCOUNTER — Emergency Department (HOSPITAL_BASED_OUTPATIENT_CLINIC_OR_DEPARTMENT_OTHER): Payer: Medicaid Other

## 2021-08-14 ENCOUNTER — Encounter: Payer: Self-pay | Admitting: Obstetrics and Gynecology

## 2021-08-14 DIAGNOSIS — N3000 Acute cystitis without hematuria: Secondary | ICD-10-CM | POA: Insufficient documentation

## 2021-08-14 DIAGNOSIS — R000 Tachycardia, unspecified: Secondary | ICD-10-CM | POA: Insufficient documentation

## 2021-08-14 DIAGNOSIS — R109 Unspecified abdominal pain: Secondary | ICD-10-CM | POA: Diagnosis not present

## 2021-08-14 DIAGNOSIS — R1031 Right lower quadrant pain: Secondary | ICD-10-CM | POA: Diagnosis present

## 2021-08-14 LAB — CBC
HCT: 38.6 % (ref 36.0–46.0)
Hemoglobin: 12 g/dL (ref 12.0–15.0)
MCH: 26.3 pg (ref 26.0–34.0)
MCHC: 31.1 g/dL (ref 30.0–36.0)
MCV: 84.6 fL (ref 80.0–100.0)
Platelets: 317 10*3/uL (ref 150–400)
RBC: 4.56 MIL/uL (ref 3.87–5.11)
RDW: 14.6 % (ref 11.5–15.5)
WBC: 7.5 10*3/uL (ref 4.0–10.5)
nRBC: 0 % (ref 0.0–0.2)

## 2021-08-14 LAB — COMPREHENSIVE METABOLIC PANEL
ALT: 31 U/L (ref 0–44)
AST: 19 U/L (ref 15–41)
Albumin: 4 g/dL (ref 3.5–5.0)
Alkaline Phosphatase: 75 U/L (ref 38–126)
Anion gap: 10 (ref 5–15)
BUN: 21 mg/dL — ABNORMAL HIGH (ref 6–20)
CO2: 21 mmol/L — ABNORMAL LOW (ref 22–32)
Calcium: 8.9 mg/dL (ref 8.9–10.3)
Chloride: 109 mmol/L (ref 98–111)
Creatinine, Ser: 0.94 mg/dL (ref 0.44–1.00)
GFR, Estimated: >60 mL/min (ref >60)
Glucose, Bld: 94 mg/dL (ref 70–99)
Potassium: 3.8 mmol/L (ref 3.5–5.1)
Sodium: 140 mmol/L (ref 135–145)
Total Bilirubin: 0.6 mg/dL (ref 0.3–1.2)
Total Protein: 7 g/dL (ref 6.5–8.1)

## 2021-08-14 LAB — URINALYSIS, ROUTINE W REFLEX MICROSCOPIC
Bilirubin Urine: NEGATIVE
Glucose, UA: NEGATIVE mg/dL
Hgb urine dipstick: NEGATIVE
Ketones, ur: NEGATIVE mg/dL
Nitrite: NEGATIVE
Protein, ur: NEGATIVE mg/dL
Specific Gravity, Urine: 1.013 (ref 1.005–1.030)
pH: 7.5 (ref 5.0–8.0)

## 2021-08-14 LAB — WET PREP, GENITAL
Clue Cells Wet Prep HPF POC: NONE SEEN
Sperm: NONE SEEN
Trich, Wet Prep: NONE SEEN
WBC, Wet Prep HPF POC: <10 (ref <10)
Yeast Wet Prep HPF POC: NONE SEEN

## 2021-08-14 LAB — LIPASE, BLOOD: Lipase: 12 U/L (ref 11–51)

## 2021-08-14 LAB — PREGNANCY, URINE: Preg Test, Ur: NEGATIVE

## 2021-08-14 MED ORDER — SODIUM CHLORIDE 0.9 % IV BOLUS
1000.0000 mL | Freq: Once | INTRAVENOUS | Status: AC
  Administered 2021-08-14: 1000 mL via INTRAVENOUS

## 2021-08-14 MED ORDER — ONDANSETRON HCL 4 MG PO TABS: 4.0000 mg | ORAL_TABLET | Freq: Four times a day (QID) | ORAL | 0 refills | Status: DC

## 2021-08-14 MED ORDER — SODIUM CHLORIDE 0.9 % IV BOLUS
500.0000 mL | Freq: Once | INTRAVENOUS | Status: AC
Start: 2021-08-14 — End: 2021-08-14
  Administered 2021-08-14: 500 mL via INTRAVENOUS

## 2021-08-14 MED ORDER — KETOROLAC TROMETHAMINE 30 MG/ML IJ SOLN
30.0000 mg | Freq: Once | INTRAMUSCULAR | Status: AC
  Administered 2021-08-14: 30 mg via INTRAVENOUS
  Filled 2021-08-14: qty 1

## 2021-08-14 MED ORDER — ONDANSETRON HCL 4 MG/2ML IJ SOLN
4.0000 mg | Freq: Once | INTRAMUSCULAR | Status: AC
  Administered 2021-08-14: 4 mg via INTRAVENOUS
  Filled 2021-08-14: qty 2

## 2021-08-14 MED ORDER — IOHEXOL 300 MG/ML  SOLN
100.0000 mL | Freq: Once | INTRAMUSCULAR | Status: AC | PRN
  Administered 2021-08-14: 100 mL via INTRAVENOUS

## 2021-08-14 MED ORDER — CEPHALEXIN 500 MG PO CAPS: 500.0000 mg | ORAL_CAPSULE | Freq: Four times a day (QID) | ORAL | 0 refills | Status: DC

## 2021-08-14 NOTE — ED Notes (Signed)
Pt transported to CT ?

## 2021-08-14 NOTE — ED Provider Notes (Signed)
[facility] EMERGENCY DEPT Provider Note   CSN: [id number] Arrival date & time: 08/14/21  1538     History  Chief Complaint  Patient presents with   Abdominal Pain    Cynthia [name] is a 21 y.o. female with a past medical history of depression, anxiety presenting with right lower quadrant pain.  Reports that it started last night but increased in severity today.  Also had an episode of emesis today.  Denies urinary symptoms.  No vaginal discharge but says she has also had a vaginal odor over the past few weeks.  Sexually active with no known STD exposures.  Last menstrual period multiple years ago secondary to Nexplanon.  No history of abdominal surgery and rates the pain 5 out of 10.   Abdominal Pain     Home Medications Prior to Admission medications   Medication Sig Start Date End Date Taking? Authorizing Provider  cetirizine (ZYRTEC) 10 MG tablet Take 1 tablet (10 mg total) by mouth daily. 08/06/21   [name], NP  Clobetasol Prop Emollient Base (CLOBETASOL PROPIONATE E) 0.05 % emollient cream BID to affected area on foot 01/07/21   Willeen Niece, MD  ELIDEL 1 % cream QAM to face Patient not taking: Reported on 05/05/2021 03/17/21   Willeen Niece, MD  etonogestrel (NEXPLANON) 68 MG IMPL implant 1 each by Subdermal route once.     [provider]  FLUoxetine (PROZAC) 20 MG capsule Take 3 capsules (60 mg total) by mouth daily. Patient not taking: Reported on 07/01/2021 06/24/21 06/24/22  Alfonso Ramus T, FNP  FLUoxetine HCl 60 MG TABS TAKE 1 TABLET BY MOUTH EVERY DAY 06/23/21   Georges Mouse, NP  hydrOXYzine (ATARAX) 25 MG tablet TAKE 1 TABLET BY MOUTH EVERY DAY AT BEDTIME AS NEEDED FOR ANXIETY 08/04/21   [name], NP  LamoTRIgine 300 MG TB24 24 hour tablet TAKE 1 TABLET BY MOUTH EVERYDAY AT BEDTIME 07/02/21   [name], NP  MELATONIN PO Take 1 tablet by mouth at bedtime as needed (sleep).    [provider]  METROCREAM  0.75 % cream QHS to face Patient not taking: Reported on 05/05/2021 04/04/21   Willeen Niece, MD  pantoprazole (PROTONIX) 40 MG tablet Take 1 tablet (40 mg total) by mouth daily. 05/05/21   [name], NP  topiramate (TOPAMAX) 50 MG tablet Take 1 tablet at bedtime 07/02/21   [name], NP      Allergies    Tape    Review of Systems   Review of Systems  Gastrointestinal:  Positive for abdominal pain.   Physical Exam Updated Vital Signs BP 119/84 (BP Location: Right Arm)   Pulse (!) 108   Temp 98.6 F (37 C) (Oral)   Resp 16   Wt (!) 141.5 kg   SpO2 100%   BMI 51.92 kg/m  Physical Exam Vitals and nursing note reviewed.  Constitutional:      Appearance: Normal appearance.  HENT:     Head: Normocephalic and atraumatic.  Eyes:     General: No scleral icterus.    Conjunctiva/sclera: Conjunctivae normal.  Cardiovascular:     Rate and Rhythm: Regular rhythm. Tachycardia present.  Pulmonary:     Effort: Pulmonary effort is normal. No respiratory distress.  Abdominal:     General: Abdomen is flat.     Palpations: Abdomen is soft.     Tenderness: There is abdominal tenderness in the right lower quadrant.  Genitourinary:    Comments: GU exam deferred,  patient would prefer to self swab Skin:    Findings: No rash.  Neurological:     Mental Status: She is alert.  Psychiatric:        Mood and Affect: Mood normal.    ED Results / Procedures / Treatments   Labs (all labs ordered are listed, but only abnormal results are displayed) Labs Reviewed  COMPREHENSIVE METABOLIC PANEL - Abnormal; Notable for the following components:      Result Value   CO2 21 (*)    BUN 21 (*)    All other components within normal limits  URINALYSIS, ROUTINE W REFLEX MICROSCOPIC - Abnormal; Notable for the following components:   APPearance HAZY (*)    Leukocytes,Ua MODERATE (*)    Bacteria, UA RARE (*)    All other components within normal limits  WET PREP, GENITAL  LIPASE, BLOOD  CBC   PREGNANCY, URINE  GC/CHLAMYDIA PROBE AMP ([place]) NOT AT [facility]    EKG None  Radiology CT ABDOMEN PELVIS W CONTRAST  Result Date: 08/14/2021 CLINICAL DATA:  Right lower quadrant abdominal pain beginning yesterday. Nausea. EXAM: CT ABDOMEN AND PELVIS WITH CONTRAST TECHNIQUE: Multidetector CT imaging of the abdomen and pelvis was performed using the standard protocol following bolus administration of intravenous contrast. RADIATION DOSE REDUCTION: This exam was performed according to the departmental dose-optimization program which includes automated exposure control, adjustment of the mA and/or kV according to patient size and/or use of iterative reconstruction technique. CONTRAST:  100mL OMNIPAQUE IOHEXOL 300 MG/ML  SOLN COMPARISON:  None Available. FINDINGS: Lower chest: Unremarkable Hepatobiliary: Contracted gallbladder.  Otherwise unremarkable. Pancreas: Unremarkable Spleen: 1.0 by 0.8 cm hypodense structure in the inferior spleen, highly likely to be benign/incidental. Adrenals/Urinary Tract: Unremarkable Stomach/Bowel: Unremarkable.  Normal appendix. Vascular/Lymphatic: Unremarkable Reproductive: Unremarkable Other: Small amount of simple appearing free pelvic fluid in the cul-de-sac, possibly physiologic. Musculoskeletal: Unremarkable IMPRESSION: 1. No specific cause for the patient's current symptoms identified. 2. Small amount of free pelvic fluid in the cul-de-sac, possibly physiologic. 3. Normal appendix. Electronically Signed   By: Gaylyn RongWalter  Liebkemann M.D.   On: 08/14/2021 18:37    Procedures Procedures   Medications Ordered in ED Medications  sodium chloride 0.9 % bolus 1,000 mL (has no administration in time range)  ondansetron (ZOFRAN) injection 4 mg (has no administration in time range)  ketorolac (TORADOL) 30 MG/ML injection 30 mg (has no administration in time range)    ED Course/ Medical Decision Making/ A&P                           Medical Decision Making Amount  and/or Complexity of Data Reviewed Labs: ordered. Radiology: ordered.  Risk Prescription drug management.   This patient presents to the ED for concern of abdominal pain. The differential diagnosis for generalized abdominal pain includes, but is not limited to AAA, gastroenteritis, appendicitis, Bowel obstruction, Bowel perforation. Gastroparesis, DKA, Hernia, Inflammatory bowel disease, mesenteric ischemia, pancreatitis, peritonitis SBP, volvulus.  Additional items on differential for this patient include ovarian cyst, ectopic pregnancy, ovarian torsion STD/PID.  This is not an exhaustive differential.    Past Medical History / Co-morbidities / Social History: Epilepsy and patient has a history of eating disorder   Physical Exam: Physical exam performed. The pertinent findings include: Right lower quadrant abdominal tenderness  Lab Tests: I ordered, and per0sonally interpreted labs.  The pertinent results include:  -UA with leuks, wbc, bacteria.  -Normal electrolytes -Negative wet prep   Imaging  Studies: I ordered imaging studies including CT abdomen pelvis. I independently visualized and interpreted imaging which showed summation of patient's symptoms I agree with the radiologist interpretation.      Medications: I ordered medication including toradol and zofran. Reevaluation of the patient after these medicines showed that the patient improved. I have reviewed the patients home medicines and have made adjustments as needed.   Disposition: After consideration of the diagnostic results and the patients response to treatment, I feel that she does not emergent intervention at this time.  CT without explanations of her symptoms.  She does have a urinary tract infection, normal white blood cell count, afebrile and not tachycardic.  Low suspicion pyelonephritis.  CVA tenderness and denies back pain he does not show nephrolithiasis either.  Declined pelvic exam but wet prep was negative.   She denies pelvic pain or vaginal discharge, doubt PID/TOA.  At this time she will be discharged with Zofran and Keflex for her urinary tract infection.  She has a follow-up appointment with her GYN and would like to speak to them about any further concerns.   Final Clinical Impression(s) / ED Diagnoses Final diagnoses:  Acute cystitis without hematuria    Rx / DC Orders ED Discharge Orders          Ordered    cephALEXin (KEFLEX) 500 MG capsule  4 times daily        08/14/21 1910    ondansetron (ZOFRAN) 4 MG tablet  Every 6 hours        08/14/21 1911           Results and diagnoses were explained to the patient. Return precautions discussed in full. Patient had no additional questions and expressed complete understanding.   This chart was dictated using voice recognition software.  Despite best efforts to proofread,  errors can occur which can change the documentation meaning.    Woodroe Chen 08/14/21 Glenard Haring, MD 08/14/21 2010

## 2021-08-14 NOTE — ED Triage Notes (Signed)
Pt is here for RLQ pain which began yesterday.  States that any movement makes the pain worse, especially coughing.  No urinary symptoms.  LM last pm.  Pt had nausea and one episode of vomiting with this.

## 2021-08-14 NOTE — Discharge Instructions (Signed)
Pick up your antibiotics and nausea medication at the pharmacy and follow-up with your GYN or primary care provider with any further concerns.

## 2021-08-15 ENCOUNTER — Telehealth: Payer: Self-pay | Admitting: *Deleted

## 2021-08-15 LAB — GC/CHLAMYDIA PROBE AMP (~~LOC~~) NOT AT ARMC
Chlamydia: NEGATIVE
Comment: NEGATIVE
Comment: NORMAL
Neisseria Gonorrhea: NEGATIVE

## 2021-08-15 NOTE — Telephone Encounter (Signed)
Transition Care Management Follow-up Telephone Call Date of discharge and from where: 08/14/2021 - [facility] How have you been since you were released from the hospital? "Okay" Any questions or concerns? No  Items Reviewed: Did the pt receive and understand the discharge instructions provided? Yes  Medications obtained and verified? Yes  Other? No  Any new allergies since your discharge? No  Dietary orders reviewed? No Do you have support at home? Yes    Functional Questionnaire: (I = Independent and D = Dependent) ADLs: I  Bathing/Dressing- I  Meal Prep- I  Eating- I  Maintaining continence- I  Transferring/Ambulation- I  Managing Meds- I  Follow up appointments reviewed:  PCP Hospital f/u appt confirmed? No   Specialist Hospital f/u appt confirmed? No   Are transportation arrangements needed? No  If their condition worsens, is the pt aware to call PCP or go to the Emergency Dept.? Yes Was the patient provided with contact information for the PCP's office or ED? Yes Was to pt encouraged to call back with questions or concerns? Yes

## 2021-08-19 ENCOUNTER — Other Ambulatory Visit (HOSPITAL_COMMUNITY)
Admission: RE | Admit: 2021-08-19 | Discharge: 2021-08-19 | Disposition: A | Discharge status: Home / Self Care | Payer: Medicaid Other | Source: Ambulatory Visit | Attending: Obstetrics and Gynecology | Admitting: Obstetrics and Gynecology

## 2021-08-19 ENCOUNTER — Ambulatory Visit (INDEPENDENT_AMBULATORY_CARE_PROVIDER_SITE_OTHER): Payer: Medicaid Other | Admitting: Obstetrics and Gynecology

## 2021-08-19 ENCOUNTER — Encounter: Payer: Self-pay | Admitting: Obstetrics and Gynecology

## 2021-08-19 VITALS — BP 139/84 | HR 83 | Wt 320.0 lb

## 2021-08-19 DIAGNOSIS — B3731 Acute candidiasis of vulva and vagina: Secondary | ICD-10-CM

## 2021-08-19 DIAGNOSIS — R1031 Right lower quadrant pain: Secondary | ICD-10-CM | POA: Diagnosis not present

## 2021-08-19 DIAGNOSIS — Z713 Dietary counseling and surveillance: Secondary | ICD-10-CM | POA: Diagnosis not present

## 2021-08-19 NOTE — Progress Notes (Unsigned)
Right sided Day before  Very intense and better  Got abx ***  Obstetrics and Gynecology Visit Return Patient Evaluation  Appointment Date: 08/19/2021  Primary Care Provider: Aviva Kluver  [facility]N Clinic: [facility] Complaint: ED follow up  History of Present Illness:  Cynthia [name] is a 21 y.o. with above CC.   Patient went to ED on 5/18 ***  Interval History: Since that time, she states that ***  Review of Systems: Positive for ***.    Otherwise, her 12 point review of systems is negative or as noted in the History of Present Illness.  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 Current Outpatient Medications  Medication Sig Dispense Refill   cephALEXin (KEFLEX) 500 MG capsule Take 1 capsule (500 mg total) by mouth 4 (four) times daily. 20 capsule 0   cetirizine (ZYRTEC) 10 MG tablet Take 1 tablet (10 mg total) by mouth daily. 30 tablet 1   etonogestrel (NEXPLANON) 68 MG IMPL implant 1 each by Subdermal route once.      FLUoxetine (PROZAC) 20 MG capsule Take 3 capsules (60 mg total) by mouth daily. 270 capsule 0   hydrOXYzine (ATARAX) 25 MG tablet TAKE 1 TABLET BY MOUTH EVERY DAY AT  BEDTIME AS NEEDED FOR ANXIETY 30 tablet 2   LamoTRIgine 300 MG TB24 24 hour tablet TAKE 1 TABLET BY MOUTH EVERYDAY AT BEDTIME 90 tablet 1   ondansetron (ZOFRAN) 4 MG tablet Take 1 tablet (4 mg total) by mouth every 6 (six) hours. 12 tablet 0   topiramate (TOPAMAX) 50 MG tablet Take 1 tablet at bedtime 30 tablet 5   Clobetasol Prop Emollient Base (CLOBETASOL PROPIONATE E) 0.05 % emollient cream BID to affected area on foot (Patient not taking: Reported on 08/19/2021) 30 g 0   FLUoxetine HCl 60 MG TABS TAKE 1 TABLET BY MOUTH EVERY DAY 90 tablet 0   MELATONIN PO Take 1 tablet by mouth at bedtime as needed (sleep).     METROCREAM 0.75 % cream QHS to face (Patient not taking: Reported on 05/05/2021) 45 g 1   pantoprazole (PROTONIX) 40 MG tablet Take 1 tablet (40 mg total) by mouth daily. 30 tablet 5   No current facility-administered medications for this visit.    Allergies: is allergic to tape.  Physical Exam:  BP 139/84   Pulse 83   Wt (!) 320 lb (145.2 kg)   BMI 53.25 kg/m  Body mass index is 53.25 kg/m. General appearance: Well nourished, well developed female in no acute distress.  Abdomen: diffusely non tender to palpation, non distended, and no masses, hernias Neuro/Psych:  Normal mood and affect.    Pelvic exam:  EGBUS: *** Vaginal vault: *** Cervix:  IUD strings *** Bimanual: ***   Assessment: ***  Plan:  1. Right lower quadrant abdominal pain *** - Urine Culture  2. Vulvovaginal candidiasis *** - Cervicovaginal ancillary only( [place])  3. Weight loss counseling, encounter for ***   RTC: ***  Cornelia Copa MD Attending [facility])

## 2021-08-19 NOTE — Progress Notes (Unsigned)
Follow up for UTI, pt states she isn't feeling any better  Having vaginal odor, irritation in folds up thighs

## 2021-08-20 LAB — CERVICOVAGINAL ANCILLARY ONLY
Bacterial Vaginitis (gardnerella): NEGATIVE
Candida Glabrata: NEGATIVE
Candida Vaginitis: POSITIVE — AB
Comment: NEGATIVE
Comment: NEGATIVE
Comment: NEGATIVE

## 2021-08-20 LAB — URINE CULTURE

## 2021-08-21 MED ORDER — FLUCONAZOLE 150 MG PO TABS: 150.0000 mg | ORAL_TABLET | Freq: Once | ORAL | 0 refills | Status: AC

## 2021-08-21 NOTE — Addendum Note (Signed)
Addended by: Oxford Bing on: 08/21/2021 09:46 PM   Modules accepted: Orders

## 2021-08-25 DIAGNOSIS — M5416 Radiculopathy, lumbar region: Secondary | ICD-10-CM | POA: Diagnosis not present

## 2021-09-03 ENCOUNTER — Other Ambulatory Visit (INDEPENDENT_AMBULATORY_CARE_PROVIDER_SITE_OTHER): Payer: Self-pay | Admitting: Family

## 2021-09-03 DIAGNOSIS — F410 Panic disorder [episodic paroxysmal anxiety] without agoraphobia: Secondary | ICD-10-CM

## 2021-09-03 DIAGNOSIS — J302 Other seasonal allergic rhinitis: Secondary | ICD-10-CM

## 2021-09-23 ENCOUNTER — Encounter: Payer: Self-pay | Admitting: Family

## 2021-09-23 ENCOUNTER — Other Ambulatory Visit: Payer: Self-pay | Admitting: Pediatrics

## 2021-09-23 ENCOUNTER — Other Ambulatory Visit (INDEPENDENT_AMBULATORY_CARE_PROVIDER_SITE_OTHER): Payer: Self-pay | Admitting: Family

## 2021-09-23 DIAGNOSIS — G43009 Migraine without aura, not intractable, without status migrainosus: Secondary | ICD-10-CM

## 2021-10-10 ENCOUNTER — Other Ambulatory Visit: Payer: Self-pay | Admitting: Pediatrics

## 2021-10-10 ENCOUNTER — Other Ambulatory Visit (INDEPENDENT_AMBULATORY_CARE_PROVIDER_SITE_OTHER): Payer: Self-pay | Admitting: Family

## 2021-10-10 NOTE — Telephone Encounter (Signed)
Received Rx Request for Topiramate. Attemtped to call patient and confirm the pharmacy. No answer. Left vm with call back

## 2021-10-22 DIAGNOSIS — R293 Abnormal posture: Secondary | ICD-10-CM | POA: Diagnosis not present

## 2021-10-22 DIAGNOSIS — M5459 Other low back pain: Secondary | ICD-10-CM | POA: Diagnosis not present

## 2021-11-03 ENCOUNTER — Ambulatory Visit (INDEPENDENT_AMBULATORY_CARE_PROVIDER_SITE_OTHER): Payer: Medicaid Other | Admitting: Family

## 2021-11-05 ENCOUNTER — Other Ambulatory Visit: Payer: Self-pay | Admitting: Pediatrics

## 2021-11-18 ENCOUNTER — Telehealth (INDEPENDENT_AMBULATORY_CARE_PROVIDER_SITE_OTHER): Payer: Medicaid Other | Admitting: Family

## 2021-11-18 ENCOUNTER — Encounter (INDEPENDENT_AMBULATORY_CARE_PROVIDER_SITE_OTHER): Payer: Self-pay

## 2021-11-20 ENCOUNTER — Other Ambulatory Visit (INDEPENDENT_AMBULATORY_CARE_PROVIDER_SITE_OTHER): Payer: Self-pay | Admitting: Family

## 2021-11-20 DIAGNOSIS — K219 Gastro-esophageal reflux disease without esophagitis: Secondary | ICD-10-CM

## 2021-11-20 NOTE — Telephone Encounter (Signed)
4/4//2023 last OV no showed video visit in Aug, will refill x 1 until appt is kept

## 2021-12-13 ENCOUNTER — Other Ambulatory Visit (INDEPENDENT_AMBULATORY_CARE_PROVIDER_SITE_OTHER): Payer: Self-pay | Admitting: Family

## 2021-12-13 DIAGNOSIS — K219 Gastro-esophageal reflux disease without esophagitis: Secondary | ICD-10-CM

## 2022-01-07 ENCOUNTER — Other Ambulatory Visit (INDEPENDENT_AMBULATORY_CARE_PROVIDER_SITE_OTHER): Payer: Self-pay | Admitting: Family

## 2022-01-07 DIAGNOSIS — K219 Gastro-esophageal reflux disease without esophagitis: Secondary | ICD-10-CM

## 2022-01-08 ENCOUNTER — Encounter (INDEPENDENT_AMBULATORY_CARE_PROVIDER_SITE_OTHER): Payer: Self-pay | Admitting: Family

## 2022-01-08 ENCOUNTER — Encounter (INDEPENDENT_AMBULATORY_CARE_PROVIDER_SITE_OTHER): Payer: Self-pay

## 2022-01-08 ENCOUNTER — Telehealth (INDEPENDENT_AMBULATORY_CARE_PROVIDER_SITE_OTHER): Payer: Medicaid Other | Admitting: Family

## 2022-01-08 VITALS — Ht 65.0 in | Wt 315.0 lb

## 2022-01-08 DIAGNOSIS — M25552 Pain in left hip: Secondary | ICD-10-CM

## 2022-01-08 DIAGNOSIS — G40B09 Juvenile myoclonic epilepsy, not intractable, without status epilepticus: Secondary | ICD-10-CM | POA: Diagnosis not present

## 2022-01-08 DIAGNOSIS — R635 Abnormal weight gain: Secondary | ICD-10-CM

## 2022-01-08 DIAGNOSIS — Z6841 Body Mass Index (BMI) 40.0 and over, adult: Secondary | ICD-10-CM

## 2022-01-08 DIAGNOSIS — G43009 Migraine without aura, not intractable, without status migrainosus: Secondary | ICD-10-CM

## 2022-01-08 DIAGNOSIS — F410 Panic disorder [episodic paroxysmal anxiety] without agoraphobia: Secondary | ICD-10-CM | POA: Diagnosis not present

## 2022-01-08 DIAGNOSIS — Z8659 Personal history of other mental and behavioral disorders: Secondary | ICD-10-CM

## 2022-01-08 DIAGNOSIS — E66813 Obesity, class 3: Secondary | ICD-10-CM

## 2022-01-08 DIAGNOSIS — K219 Gastro-esophageal reflux disease without esophagitis: Secondary | ICD-10-CM

## 2022-01-08 MED ORDER — LAMOTRIGINE ER 300 MG PO TB24: ORAL_TABLET | ORAL | 1 refills | Status: DC

## 2022-01-08 MED ORDER — FLUOXETINE HCL 20 MG PO CAPS: ORAL_CAPSULE | ORAL | 1 refills | Status: DC

## 2022-01-08 MED ORDER — PANTOPRAZOLE SODIUM 40 MG PO TBEC: 40.0000 mg | DELAYED_RELEASE_TABLET | Freq: Every day | ORAL | 5 refills | Status: DC

## 2022-01-08 MED ORDER — TOPIRAMATE 50 MG PO TABS: ORAL_TABLET | ORAL | 5 refills | Status: DC

## 2022-01-08 NOTE — Progress Notes (Signed)
This is a Pediatric Specialist E-Visit consult/follow up provided via My Chart Cynthia [name] consented to an E-Visit consult today.  Location of patient: Cynthia [name] is at home. Location of provider: Damita Dunnings is at office Patient was referred by Jonetta Speak, MD   The following participants were involved in this E-Visit: RN, patient and NP   This visit was done via VIDEO   Chief Complain/ Reason for E-Visit today: seizure follow up Total time on call: 30 min Follow up: 2 months   Cynthia [name]   MRN:  [id number]  09-07-2000   Provider: [name] NP-C Location of Care: [facility]  Visit type: Video return visit  Last visit: 07/01/2021  Referral source: Jonetta Speak, MD History from: Epic chart and patient  Brief history:  Copied from previous record: History of primary generalized epilepsy, migraines, panic and anxiety. She is taking Lamotrigine ER and Clonazepam for seizures, and Fluoxetine and Hydroxyzine for her mood disorder. Her last seizure occurred 01/14/20 in the setting of sleep deprivation. She has been seen by [facility] for anxiety and panic, which is typically triggered by disagreements with her parents. She also has some anxiety in crowds. She is taking and tolerating Topiramate for migraine prevention. She has been hospitalized for overdose attempts with the most recent being in October 2020. She is being followed by [facility] for eating disorder.  Today's concerns: Cynthia [name] reports today that she has remained seizure free. Her last seizure occurred 01/14/20 and she is interested in knowing if she could taper off seizure medication.   Cynthia [name] reports that she continues to experience left hip pain but says that it has not worsened. She went to some PT sessions but was frustrated with the PT telling her to lose weight and stopped going.   Cynthia [name] reports that her appetite has been down  recently and that she cannot eat more than a few bites at a time. She says that she wants to eat but gets full quickly. She denies problems swallowing or problems with constipation.   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   Cynthia [name] reports recent feelings of depression. She has not been taking Fluoxetine because she was out of refills. She is interested in working with a therapist and has questions about that. Cynthia [name] says that some of her depression is regarding her living situation. She is currently living with her boyfriend and his grandmother. Cynthia [name] isn't happy sharing a home with the grandmother but says that she can't afford to move out, and her boyfriend isn't willing to leave his grandmother's home. Cynthia [name] also reports that she has some disinterest in intimacy with her boyfriend as she has gotten more depressed.   Cynthia [name] had questions today about how to get established with an adult PCP provider. She has been otherwise generally healthy since she was last seen.  She has no other health concerns today other than previously mentioned.  Review of systems: Please see HPI for neurologic and other pertinent review of systems. Otherwise all other systems were reviewed and were negative.  Problem List: Patient Active Problem List   Diagnosis Date Noted   Pain of left hip 07/01/2021   Mood disorder in conditions classified elsewhere 07/01/2021   Gastroesophageal reflux disease 05/11/2021   Weight gain 11/30/2020   Eating disorder 11/30/2020   History of depression 04/28/2020   History of  anxiety 04/28/2020   Intentional drug overdose (HCC)    Severe episode of recurrent major depressive disorder, without psychotic features (HCC) 07/14/2018   Migraine without aura and without status migrainosus, not intractable 05/27/2018   Panic attacks 12/31/2016   Frequent headaches 03/16/2016   Nonintractable  juvenile myoclonic epilepsy without status epilepticus (HCC) 04/18/2015     Past Medical History:  Diagnosis Date   Anxiety    Phreesia 09/11/2019   Depression    Phreesia 04/25/2020   Headache    Intentional SSRI (selective serotonin reuptake inhibitor) overdose, initial encounter (HCC) 01/10/2019   Overdose of benzodiazepine, intentional self-harm, initial encounter (HCC) 01/10/2019   Seizures (HCC)    Phreesia 09/11/2019   Vision abnormalities     Past medical history comments: See HPI Copied from previous record: She took Keppra in the past but was changed to Trokendi XR in 2017 because of ongoing seizures and for migraine prevention. An EEG performed January 08, 2017 was normal. 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 An EEG done on February 07, 2018 was normal awake and asleep. Keppra was restarted in 2019 because of seizure activity but stopped due to side effects  Surgical history: Past Surgical History:  Procedure Laterality Date   TONSILLECTOMY Bilateral 2007   Performed at [facility]   TYMPANOSTOMY TUBE PLACEMENT Bilateral 2004   Performed at [facility]     Family history: family history includes Anxiety disorder in her maternal aunt, maternal grandmother, and mother; Autism in an other family member; Bipolar disorder in her maternal aunt, maternal grandmother, and mother; Depression in her maternal aunt, maternal grandmother, and mother; Hypertension in her father, maternal grandfather, maternal grandmother, mother, paternal grandfather, and paternal grandmother; Lung cancer in her paternal grandfather; Migraines in her maternal aunt, maternal grandfather, maternal grandmother, and mother; Seizures in an other family member.   Social history: Social History   Socioeconomic History   Marital status: Single    Spouse name: Not on file   Number of children: Not on file   Years of education: Not on file   Highest education level: Not on  file  Occupational History   Not on file  Tobacco Use   Smoking status: Smoker, Current Status Unknown    Types: E-cigarettes   Smokeless tobacco: Never  Vaping Use   Vaping Use: Never used  Substance and Sexual Activity   Alcohol use: Yes   Drug use: Yes    Types: Marijuana   Sexual activity: Yes    Birth control/protection: Implant  Other Topics Concern   Not on file  Social History Narrative   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   Lives with her boyfriend and his grandmother      Social Determinants of Corporate investment banker Strain: Not on [employer] Insecurity: Not on file  Transportation Needs: Not on file  Physical Activity: Not on file  Stress: Not on file  Social Connections: Not on file  Intimate Partner Violence: Not on file    Past/failed meds: Copied from previous record: Trokendi XR - improved headaches, later changed to immediate release Topiramate Lamotrigine - side effects - has tolerated Lamotrigine ER well  Allergies: Allergies  Allergen Reactions   Meloxicam Other (See Comments)    Caused mood swings   Tape Dermatitis    When regular hospital tape applied to arm, swelling and redness developed at  the site. Where paper tape applied, no reaction noted.     Immunizations: Immunization History  Administered Date(s) Administered   Influenza,inj,Quad PF,6+ Mos 01/12/2019   Moderna Sars-Covid-2 Vaccination 02/04/2020   Pfizer Covid-19 Vaccine Bivalent Booster 22yrs & up 04/29/2021    Diagnostics/Screenings: Copied from previous record: rEEG 02/07/18 - normal awake and asleep. Bill Salinas, MD   rEEG 02/19/16 - abnormal with the patient awake, drowsy and asleep. The presence of generalized spike and wave discharge is consistent with her primary generalized epilepsy and raises the risk of recurrent seizures. Bill Salinas, MD  Physical Exam: Ht 5\' 5"  (1.651 m)   Wt (!) 315 lb (142.9 kg)   BMI  52.42 kg/m   Wt Readings from Last 3 Encounters:  01/08/22 (!) 315 lb (142.9 kg)  08/19/21 (!) 320 lb (145.2 kg)  08/14/21 (!) 312 lb (141.5 kg)    General: Well developed, well nourished obese young woman, seated at her home, in no evident distress Head: Head normocephalic and atraumatic.   Neck: Supple Musculoskeletal: No obvious deformities or scoliosis Skin: No rashes or neurocutaneous lesions  Neurologic Exam Mental Status: Awake and fully alert.  Oriented to place and time.  Recent and remote memory intact.  Attention span, concentration, and fund of knowledge appropriate.  Mood and affect appropriate. Cranial Nerves: Extraocular movements full without nystagmus. Hearing intact on video. Face, tongue, palate move normally and symmetrically. Motor: Normal functional bulk, tone and strength Sensory: Intact to touch and temperature in all extremities.  Coordination: Finger-to-nose and heel-to shin performed accurately bilaterally.  Gait and Station: Arises from chair without difficulty.  Stance is normal. Gait demonstrates normal stride length and balance.    Impression: Nonintractable juvenile myoclonic epilepsy without status epilepticus ([place]) - Plan: EEG Child, LamoTRIgine 300 MG TB24 24 hour tablet  Migraine without aura and without status migrainosus, not intractable - Plan: topiramate (TOPAMAX) 50 MG tablet  Gastroesophageal reflux disease, unspecified whether esophagitis present - Plan: pantoprazole (PROTONIX) 40 MG tablet  Pain of left hip  Weight gain  History of depression  History of anxiety  Panic attacks  Class 3 severe obesity due to excess calories without serious comorbidity with body mass index (BMI) of 50.0 to 59.9 in adult [facility])   Recommendations for plan of care: The patient's previous Epic records were reviewed. Dashley has neither had nor required imaging or lab studies since the last visit. She has remained seizure free and is interested in tapering  off medication. I explained that we will need to perform an EEG to determine if she can safely taper off medication. I scheduled the EEG for [facility] and will call her when I receive the results. We talked about her depression and I recommended restarting the Fluoxetine. I explained to her how to locate a therapist and encouraged her to do so. She specifically denied any desire or plan to harm herself, and agreed to seek help at the ER if she has thoughts of this nature. We also talked about how to get established with a PCP. I will see Lowella back in follow up in 2 months or sooner if needed.   The medication list was reviewed and reconciled. I reviewed the medication changes that were made in the prescribed medications today. A complete medication list was provided to the patient.  Orders Placed This Encounter  Procedures   EEG Child    Standing Status:   Future    Standing Expiration Date:   01/09/2023  Order Specific Question:   Where should this test be performed?    Answer:   [facility]    Order Specific Question:   Reason for exam    Answer:   Other (see comment)    Order Specific Question:   Comment    Answer:   history of seizures - perform EEG to determine if she can safely taper off medication.    Return in about 2 months (around 03/10/2022).   Allergies as of 01/08/2022       Reactions   Meloxicam Other (See Comments)   Caused mood swings   Tape Dermatitis   When regular hospital tape applied to arm, swelling and redness developed at the site. Where paper tape applied, no reaction noted.         Medication List        Accurate as of January 08, 2022 11:59 PM. If you have any questions, ask your nurse or doctor.          STOP taking these medications    cephALEXin 500 MG capsule Commonly known as: KEFLEX Stopped by: [name], NP   Clobetasol Prop Emollient Base 0.05 % emollient cream Commonly known as: Clobetasol Propionate E Stopped by:  [name], NP   MetroCream 0.75 % cream Generic drug: metroNIDAZOLE Stopped by: [name], NP       TAKE these medications    cetirizine 10 MG tablet Commonly known as: ZYRTEC TAKE 1 TABLET BY MOUTH EVERY DAY   FLUoxetine 20 MG capsule Commonly known as: PROZAC Take 1 capsule per day for 2 weeks, then take 2 capsules per day What changed:  how much to take how to take this when to take this additional instructions Another medication with the same name was removed. Continue taking this medication, and follow the directions you see here. Changed by: [name], NP   fluticasone 50 MCG/ACT nasal spray Commonly known as: FLONASE Place into both nostrils.   hydrOXYzine 25 MG tablet Commonly known as: ATARAX TAKE 1 TABLET BY MOUTH EVERY DAY AT BEDTIME AS NEEDED FOR ANXIETY   LamoTRIgine 300 MG Tb24 24 hour tablet TAKE 1 TABLET BY MOUTH EVERYDAY AT BEDTIME   MELATONIN PO Take 1 tablet by mouth at bedtime as needed (sleep).   Nexplanon 68 MG Impl implant Generic drug: etonogestrel 1 each by Subdermal route once.   ondansetron 4 MG tablet Commonly known as: ZOFRAN Take 1 tablet (4 mg total) by mouth every 6 (six) hours.   pantoprazole 40 MG tablet Commonly known as: PROTONIX Take 1 tablet (40 mg total) by mouth daily.   topiramate 50 MG tablet Commonly known as: Topamax Take 1 tablet at bedtime What changed: Another medication with the same name was removed. Continue taking this medication, and follow the directions you see here. Changed by: [name], NP      Total time spent with the patient was 30 minutes, of which 50% or more was spent in counseling and coordination of care.  [name] NP-C [facility] Ph. [phone] Fax [phone]

## 2022-01-08 NOTE — Patient Instructions (Signed)
It was a pleasure to see you today!  Instructions for you until your next appointment are as follows: Work on getting established with a therapist - go to [email or web address] to find someone Restart Fluoxetine (Prozac) 20mg  - 1 capsule per day for 2 weeks, then take 2 capsules per day after that I updated the prescription for Topiramate 50mg  tablets I have scheduled an EEG for January 29, 2022 at 11AM at this office. I will call you a few days after the EEG to review the results with you.  Be sure to continue taking the Lamotrigine as prescribed - do not reduce the dose until we can get the EEG results and see how it looks Work on getting established with a PCP as we discussed today.  Please sign up for MyChart if you have not done so. Please plan to return for follow up in 2 months or sooner if needed.  Feel free to contact our office during normal business hours at [phone] with questions or concerns. If there is no answer or the call is outside business hours, please leave a message and our clinic staff will call you back within the next business day.  If you have an urgent concern, please stay on the line for our after-hours answering service and ask for the on-call neurologist.     I also encourage you to use MyChart to communicate with me more directly. If you have not yet signed up for MyChart within [facility], the front desk staff can help you. However, please note that this inbox is NOT monitored on nights or weekends, and response can take up to 2 business days.  Urgent matters should be discussed with the on-call pediatric neurologist.   At [facility], we are committed to providing exceptional care. You will receive a patient satisfaction survey through text or email regarding your visit today. Your opinion is important to me. Comments are appreciated.

## 2022-01-11 ENCOUNTER — Encounter (INDEPENDENT_AMBULATORY_CARE_PROVIDER_SITE_OTHER): Payer: Self-pay | Admitting: Family

## 2022-01-29 ENCOUNTER — Ambulatory Visit (INDEPENDENT_AMBULATORY_CARE_PROVIDER_SITE_OTHER): Payer: Medicaid Other | Admitting: Neurology

## 2022-01-29 DIAGNOSIS — G40B09 Juvenile myoclonic epilepsy, not intractable, without status epilepticus: Secondary | ICD-10-CM | POA: Diagnosis not present

## 2022-01-29 DIAGNOSIS — Z7721 Contact with and (suspected) exposure to potentially hazardous body fluids: Secondary | ICD-10-CM | POA: Diagnosis not present

## 2022-01-29 NOTE — Progress Notes (Signed)
EEG complete - results pending 

## 2022-01-30 ENCOUNTER — Telehealth (INDEPENDENT_AMBULATORY_CARE_PROVIDER_SITE_OTHER): Payer: Self-pay | Admitting: Family

## 2022-01-30 NOTE — Procedures (Signed)
Patient:  Cynthia [name]   Sex: female  DOB:  Jul 31, 2000  Date of study: 01/29/2022                Clinical history: This is a 21 year old female with history of generalized epilepsy with no clinical seizure activity for a while.  This is a follow-up EEG for evaluation of epileptiform discharges and if she would be able to discontinue medication.  Medication: Lamotrigine, hydroxyzine, topiramate, fluoxetine              Procedure: The tracing was carried out on a 32 channel digital Cadwell recorder reformatted into 16 channel montages with 1 devoted to EKG.  The 10 /20 international system electrode placement was used. Recording was done during awake state.  Recording time 30 minutes.   Description of findings: Background rhythm consists of amplitude of     40 microvolt and frequency of 10-11 hertz posterior dominant rhythm. There was normal anterior posterior gradient noted. Background was well organized, continuous and symmetric with no focal slowing. There was muscle artifact noted. Hyperventilation resulted in slowing of the background activity. Photic stimulation using stepwise increase in photic frequency resulted in bilateral symmetric driving response. Throughout the recording there were no focal or generalized epileptiform activities in the form of spikes or sharps noted. There were no transient rhythmic activities or electrographic seizures noted. One lead EKG rhythm strip revealed sinus rhythm at a rate of 75 bpm.  Impression: This EEG is normal during awake state. Please note that normal EEG does not exclude epilepsy, clinical correlation is indicated.      Teressa Lower, MD

## 2022-01-30 NOTE — Telephone Encounter (Signed)
I called normal EEG results to [facility] and explained that we can consider tapering off medication. I also explained that it affects her driving and that she would be restricted from driving while tapering. She will think about what she wants to do and let me know. TG

## 2022-02-05 ENCOUNTER — Other Ambulatory Visit (INDEPENDENT_AMBULATORY_CARE_PROVIDER_SITE_OTHER): Payer: Self-pay | Admitting: Family

## 2022-02-16 ENCOUNTER — Encounter (INDEPENDENT_AMBULATORY_CARE_PROVIDER_SITE_OTHER): Payer: Self-pay

## 2022-02-26 NOTE — Telephone Encounter (Signed)
The letter was written and mailed today. TG

## 2022-03-19 ENCOUNTER — Encounter (INDEPENDENT_AMBULATORY_CARE_PROVIDER_SITE_OTHER): Payer: Self-pay

## 2022-03-20 DIAGNOSIS — M545 Low back pain, unspecified: Secondary | ICD-10-CM | POA: Diagnosis not present

## 2022-03-31 DIAGNOSIS — U071 COVID-19: Secondary | ICD-10-CM | POA: Diagnosis not present

## 2022-03-31 DIAGNOSIS — Z03818 Encounter for observation for suspected exposure to other biological agents ruled out: Secondary | ICD-10-CM | POA: Diagnosis not present

## 2022-04-06 ENCOUNTER — Other Ambulatory Visit (INDEPENDENT_AMBULATORY_CARE_PROVIDER_SITE_OTHER): Payer: Self-pay | Admitting: Family

## 2022-04-06 DIAGNOSIS — F410 Panic disorder [episodic paroxysmal anxiety] without agoraphobia: Secondary | ICD-10-CM

## 2022-04-06 DIAGNOSIS — J302 Other seasonal allergic rhinitis: Secondary | ICD-10-CM

## 2022-04-06 NOTE — Telephone Encounter (Signed)
Seen 12/2021 due follow up 02/2022 appt not scheduled- Med refill request for Hydroxyzine and Cetrizine are for 30 d supply and rx was changed to 90 d supply. Filled 6/23 with 1 refill. Refused refill and my chart message sent to patient to call and sched follow up appt

## 2022-05-04 ENCOUNTER — Ambulatory Visit (INDEPENDENT_AMBULATORY_CARE_PROVIDER_SITE_OTHER): Payer: Medicaid Other | Admitting: Family

## 2022-05-08 ENCOUNTER — Other Ambulatory Visit (INDEPENDENT_AMBULATORY_CARE_PROVIDER_SITE_OTHER): Payer: Self-pay | Admitting: Family

## 2022-05-08 DIAGNOSIS — J302 Other seasonal allergic rhinitis: Secondary | ICD-10-CM

## 2022-05-08 DIAGNOSIS — F410 Panic disorder [episodic paroxysmal anxiety] without agoraphobia: Secondary | ICD-10-CM

## 2022-05-19 ENCOUNTER — Other Ambulatory Visit (INDEPENDENT_AMBULATORY_CARE_PROVIDER_SITE_OTHER): Payer: Self-pay

## 2022-05-19 DIAGNOSIS — K219 Gastro-esophageal reflux disease without esophagitis: Secondary | ICD-10-CM

## 2022-05-19 DIAGNOSIS — G43009 Migraine without aura, not intractable, without status migrainosus: Secondary | ICD-10-CM

## 2022-05-19 DIAGNOSIS — F410 Panic disorder [episodic paroxysmal anxiety] without agoraphobia: Secondary | ICD-10-CM

## 2022-05-19 DIAGNOSIS — J302 Other seasonal allergic rhinitis: Secondary | ICD-10-CM

## 2022-05-19 MED ORDER — TOPIRAMATE 50 MG PO TABS: ORAL_TABLET | ORAL | 0 refills | Status: DC

## 2022-05-19 MED ORDER — CETIRIZINE HCL 10 MG PO TABS: 10.0000 mg | ORAL_TABLET | Freq: Every day | ORAL | 0 refills | Status: DC

## 2022-05-19 MED ORDER — PANTOPRAZOLE SODIUM 40 MG PO TBEC: 40.0000 mg | DELAYED_RELEASE_TABLET | Freq: Every day | ORAL | 0 refills | Status: DC

## 2022-05-19 MED ORDER — HYDROXYZINE HCL 25 MG PO TABS: ORAL_TABLET | ORAL | 0 refills | Status: DC

## 2022-05-22 ENCOUNTER — Ambulatory Visit (INDEPENDENT_AMBULATORY_CARE_PROVIDER_SITE_OTHER): Payer: Self-pay | Admitting: Family

## 2022-05-28 ENCOUNTER — Encounter (INDEPENDENT_AMBULATORY_CARE_PROVIDER_SITE_OTHER): Payer: Self-pay

## 2022-05-28 ENCOUNTER — Ambulatory Visit (INDEPENDENT_AMBULATORY_CARE_PROVIDER_SITE_OTHER): Payer: Medicaid Other | Admitting: Family

## 2022-05-28 ENCOUNTER — Encounter (INDEPENDENT_AMBULATORY_CARE_PROVIDER_SITE_OTHER): Payer: Self-pay | Admitting: Family

## 2022-05-28 VITALS — BP 102/62 | HR 86 | Ht 64.37 in | Wt 303.9 lb

## 2022-05-28 DIAGNOSIS — G43009 Migraine without aura, not intractable, without status migrainosus: Secondary | ICD-10-CM

## 2022-05-28 DIAGNOSIS — Z6841 Body Mass Index (BMI) 40.0 and over, adult: Secondary | ICD-10-CM | POA: Diagnosis not present

## 2022-05-28 DIAGNOSIS — G40B09 Juvenile myoclonic epilepsy, not intractable, without status epilepticus: Secondary | ICD-10-CM | POA: Diagnosis not present

## 2022-05-28 DIAGNOSIS — Z8659 Personal history of other mental and behavioral disorders: Secondary | ICD-10-CM | POA: Diagnosis not present

## 2022-05-28 NOTE — Patient Instructions (Addendum)
It was a pleasure to see you today!  Instructions for you until your next appointment are as follows: Continue taking your medications as prescribed. Let me know if you have any seizures I will fax the DMV form today.  Remember that it is important for you to avoid skipping meals, to drink plenty of water each day and to stay on a sleep schedule. You should get at least 8 hours of sleep each night as these things are known to reduce how often headaches occur.   Let me know if your headaches become more frequent or more severe. Work on [employer] and exercise program. You have lost some weight since your last visit.  Work on applying for [employer] as we discussed today Work on finding out if there is an adult neurology provider in your area. I will be happy to transfer your care there.  Please sign up for MyChart if you have not done so. Please plan to return for follow up in 6 months or sooner if needed.  Feel free to contact our office during normal business hours at [phone] with questions or concerns. If there is no answer or the call is outside business hours, please leave a message and our clinic staff will call you back within the next business day.  If you have an urgent concern, please stay on the line for our after-hours answering service and ask for the on-call neurologist.     I also encourage you to use MyChart to communicate with me more directly. If you have not yet signed up for MyChart within [facility], the front desk staff can help you. However, please note that this inbox is NOT monitored on nights or weekends, and response can take up to 2 business days.  Urgent matters should be discussed with the on-call pediatric neurologist.   At [facility], we are committed to providing exceptional care. You will receive a patient satisfaction survey through text or email regarding your visit today. Your opinion is important to me. Comments are appreciated.
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
## 2022-06-05 ENCOUNTER — Other Ambulatory Visit (INDEPENDENT_AMBULATORY_CARE_PROVIDER_SITE_OTHER): Payer: Self-pay | Admitting: Family

## 2022-06-05 DIAGNOSIS — F410 Panic disorder [episodic paroxysmal anxiety] without agoraphobia: Secondary | ICD-10-CM

## 2022-06-24 ENCOUNTER — Other Ambulatory Visit (INDEPENDENT_AMBULATORY_CARE_PROVIDER_SITE_OTHER): Payer: Self-pay | Admitting: Family

## 2022-06-24 DIAGNOSIS — J302 Other seasonal allergic rhinitis: Secondary | ICD-10-CM

## 2022-06-25 ENCOUNTER — Other Ambulatory Visit (INDEPENDENT_AMBULATORY_CARE_PROVIDER_SITE_OTHER): Payer: Self-pay | Admitting: Family

## 2022-06-25 DIAGNOSIS — K219 Gastro-esophageal reflux disease without esophagitis: Secondary | ICD-10-CM

## 2022-06-27 ENCOUNTER — Encounter (INDEPENDENT_AMBULATORY_CARE_PROVIDER_SITE_OTHER): Payer: Self-pay

## 2022-06-27 DIAGNOSIS — G40B09 Juvenile myoclonic epilepsy, not intractable, without status epilepticus: Secondary | ICD-10-CM

## 2022-06-29 MED ORDER — LAMOTRIGINE ER 300 MG PO TB24
ORAL_TABLET | ORAL | 1 refills | Status: DC
Start: 2022-06-29 — End: 2022-12-24

## 2022-07-28 ENCOUNTER — Encounter (INDEPENDENT_AMBULATORY_CARE_PROVIDER_SITE_OTHER): Payer: Self-pay

## 2022-07-28 DIAGNOSIS — G40B09 Juvenile myoclonic epilepsy, not intractable, without status epilepticus: Secondary | ICD-10-CM

## 2022-08-03 ENCOUNTER — Encounter (INDEPENDENT_AMBULATORY_CARE_PROVIDER_SITE_OTHER): Payer: Self-pay

## 2022-08-04 ENCOUNTER — Encounter (INDEPENDENT_AMBULATORY_CARE_PROVIDER_SITE_OTHER): Payer: Self-pay

## 2022-08-09 ENCOUNTER — Other Ambulatory Visit (INDEPENDENT_AMBULATORY_CARE_PROVIDER_SITE_OTHER): Payer: Self-pay | Admitting: Family

## 2022-08-09 DIAGNOSIS — G43009 Migraine without aura, not intractable, without status migrainosus: Secondary | ICD-10-CM

## 2022-10-26 ENCOUNTER — Encounter (INDEPENDENT_AMBULATORY_CARE_PROVIDER_SITE_OTHER): Payer: Self-pay

## 2022-10-29 ENCOUNTER — Ambulatory Visit (INDEPENDENT_AMBULATORY_CARE_PROVIDER_SITE_OTHER): Payer: Medicaid Other | Admitting: Family

## 2022-11-26 ENCOUNTER — Encounter (INDEPENDENT_AMBULATORY_CARE_PROVIDER_SITE_OTHER): Payer: Self-pay

## 2022-12-24 ENCOUNTER — Emergency Department: Payer: Medicaid Other

## 2022-12-24 ENCOUNTER — Emergency Department
Admission: EM | Admit: 2022-12-24 | Discharge: 2022-12-24 | Disposition: A | Discharge status: Home / Self Care | Payer: Medicaid Other | Attending: Emergency Medicine | Admitting: Emergency Medicine

## 2022-12-24 DIAGNOSIS — R519 Headache, unspecified: Secondary | ICD-10-CM | POA: Diagnosis not present

## 2022-12-24 DIAGNOSIS — R569 Unspecified convulsions: Secondary | ICD-10-CM | POA: Diagnosis not present

## 2022-12-24 DIAGNOSIS — S199XXA Unspecified injury of neck, initial encounter: Secondary | ICD-10-CM | POA: Diagnosis not present

## 2022-12-24 DIAGNOSIS — S0990XA Unspecified injury of head, initial encounter: Secondary | ICD-10-CM | POA: Diagnosis not present

## 2022-12-24 DIAGNOSIS — R9431 Abnormal electrocardiogram [ECG] [EKG]: Secondary | ICD-10-CM | POA: Diagnosis not present

## 2022-12-24 DIAGNOSIS — Y990 Civilian activity done for income or pay: Secondary | ICD-10-CM | POA: Diagnosis not present

## 2022-12-24 DIAGNOSIS — S0993XA Unspecified injury of face, initial encounter: Secondary | ICD-10-CM | POA: Diagnosis not present

## 2022-12-24 DIAGNOSIS — G40909 Epilepsy, unspecified, not intractable, without status epilepticus: Secondary | ICD-10-CM | POA: Insufficient documentation

## 2022-12-24 DIAGNOSIS — W228XXA Striking against or struck by other objects, initial encounter: Secondary | ICD-10-CM | POA: Insufficient documentation

## 2022-12-24 LAB — CBC
HCT: 41.1 % (ref 36.0–46.0)
Hemoglobin: 13.4 g/dL (ref 12.0–15.0)
MCH: 28.2 pg (ref 26.0–34.0)
MCHC: 32.6 g/dL (ref 30.0–36.0)
MCV: 86.5 fL (ref 80.0–100.0)
Platelets: 348 10*3/uL (ref 150–400)
RBC: 4.75 MIL/uL (ref 3.87–5.11)
RDW: 13.8 % (ref 11.5–15.5)
WBC: 15 10*3/uL — ABNORMAL HIGH (ref 4.0–10.5)
nRBC: 0 % (ref 0.0–0.2)

## 2022-12-24 LAB — BASIC METABOLIC PANEL
Anion gap: 12 (ref 5–15)
BUN: 19 mg/dL (ref 6–20)
CO2: 25 mmol/L (ref 22–32)
Calcium: 9 mg/dL (ref 8.9–10.3)
Chloride: 101 mmol/L (ref 98–111)
Creatinine, Ser: 1.03 mg/dL — ABNORMAL HIGH (ref 0.44–1.00)
GFR, Estimated: >60 mL/min (ref >60)
Glucose, Bld: 81 mg/dL (ref 70–99)
Potassium: 3.7 mmol/L (ref 3.5–5.1)
Sodium: 138 mmol/L (ref 135–145)

## 2022-12-24 MED ORDER — TOPIRAMATE 50 MG PO TABS: 50.0000 mg | ORAL_TABLET | Freq: Two times a day (BID) | ORAL | 0 refills | Status: DC

## 2022-12-24 MED ORDER — LEVETIRACETAM 750 MG PO TABS: 750.0000 mg | ORAL_TABLET | Freq: Two times a day (BID) | ORAL | 1 refills | Status: DC

## 2022-12-24 MED ORDER — LORAZEPAM 2 MG/ML IJ SOLN: 1.0000 mg | Freq: Once | INTRAMUSCULAR | Status: AC

## 2022-12-24 MED ORDER — SODIUM CHLORIDE 0.9 % IV SOLN
2000.0000 mg | Freq: Once | INTRAVENOUS | Status: AC
  Administered 2022-12-24: 2000 mg via INTRAVENOUS
  Filled 2022-12-24: qty 20

## 2022-12-24 MED ORDER — ACETAMINOPHEN 500 MG PO TABS
1000.0000 mg | ORAL_TABLET | Freq: Once | ORAL | Status: AC
  Administered 2022-12-24: 1000 mg via ORAL
  Filled 2022-12-24: qty 2

## 2022-12-24 MED ORDER — LORAZEPAM 2 MG/ML PO CONC: 1.0000 mg | Freq: Once | ORAL | Status: DC

## 2022-12-24 MED ORDER — TOPIRAMATE 25 MG PO TABS
50.0000 mg | ORAL_TABLET | Freq: Once | ORAL | Status: AC
  Administered 2022-12-24: 50 mg via ORAL
  Filled 2022-12-24: qty 2

## 2022-12-24 MED ORDER — KETOROLAC TROMETHAMINE 30 MG/ML IJ SOLN
15.0000 mg | Freq: Once | INTRAMUSCULAR | Status: AC
  Administered 2022-12-24: 15 mg via INTRAVENOUS
  Filled 2022-12-24: qty 1

## 2022-12-24 MED ORDER — SODIUM CHLORIDE 0.9 % IV BOLUS
1000.0000 mL | Freq: Once | INTRAVENOUS | Status: AC
  Administered 2022-12-24: 1000 mL via INTRAVENOUS

## 2022-12-24 MED ORDER — LORAZEPAM 2 MG/ML IJ SOLN
INTRAMUSCULAR | Status: AC
  Administered 2022-12-24: 1 mg via INTRAVENOUS
  Filled 2022-12-24: qty 1

## 2022-12-24 NOTE — ED Notes (Signed)
Patient given discharge instructions including prescriptions x2 and RX discounts provided. INT removed, cannula intact, pressure dressing applied. Patient stable and ambulatory with steady even gait on dispo.
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
## 2022-12-24 NOTE — Discharge Instructions (Signed)
We will start   KEPPRA 750 mg twice a day TOPAMAX 50 mg twice a day (this is one extra dose compared to your usual per day)  I have provided prescriptions and coupons. You should use GoodRx or local pharmacy discounts when purchasing without insurance for the best prices.

## 2022-12-24 NOTE — ED Provider Notes (Signed)
[facility] Provider Note    Event Date/Time   First MD Initiated Contact with Patient 12/24/22 1904     (approximate)   History   Seizures (Witnessed seizure at work (grand mal) h/o seizures, currently taking lamictal; Patient denies missing any doses or changes to medication)   HPI  Cynthia [name] is a 22 y.o. female with history of headache, seizures, here with breakthrough seizure.  The patient states that she has been unable to afford her seizure meds for the last month or so.  She states that she had a cold last week.  She had a witnessed, generalized, tonic-clonic seizure lasting several minutes today.  She hit her head.  She has had facial pain since then.  No fevers or chills.  No other complaints.     Physical Exam   Triage Vital Signs: ED Triage Vitals  Encounter Vitals Group     BP 12/24/22 1657 (!) 124/92     Systolic BP Percentile --      Diastolic BP Percentile --      Pulse Rate 12/24/22 1657 88     Resp 12/24/22 1657 20     Temp 12/24/22 1657 99 F (37.2 C)     Temp Source 12/24/22 1657 Oral     SpO2 12/24/22 1657 97 %     Weight 12/24/22 1658 280 lb (127 kg)     Height 12/24/22 1658 5\' 5"  (1.651 m)     Head Circumference --      Peak Flow --      Pain Score 12/24/22 1725 10     Pain Loc --      Pain Education --      Exclude from Growth Chart --     Most recent vital signs: Vitals:   12/24/22 2230 12/24/22 2234  BP: 109/86   Pulse: 93   Resp:  18  Temp:  97.6 F (36.4 C)  SpO2: 100%      General: Awake, no distress.  CV:  Good peripheral perfusion.  Regular rate and rhythm.  No murmurs. Resp:  Normal work of breathing.  Abd:  No distention.  No tenderness. Other:  Moderate lip swelling and contusion to the upper lip, no deep lacerations.  Tenderness over the maxillary bones bilaterally.  Dried blood in the nares but no nasal septal hematoma.  Cranial nerves II through XII intact.  Strength out of 5 bilateral  upper and lower extremities.  Normal sensation to light touch.   ED Results / Procedures / Treatments   Labs (all labs ordered are listed, but only abnormal results are displayed) Labs Reviewed  BASIC METABOLIC PANEL - Abnormal; Notable for the following components:      Result Value   Creatinine, Ser 1.03 (*)    All other components within normal limits  CBC - Abnormal; Notable for the following components:   WBC 15.0 (*)    All other components within normal limits  POC URINE PREG, ED     EKG Normal sinus rhythm, ventricular 94.  PR 132, QRS 92, QTc 445.  No acute ST elevations repress of any acute evidence of acute ischemia or infarct.   RADIOLOGY CT head/C-spine: No acute abnormality CT face: Mucosal disease, otherwise no acute fracture   I also independently reviewed and agree with radiologist interpretations.   PROCEDURES:  Critical Care performed: No   MEDICATIONS ORDERED IN ED: Medications  LORazepam (ATIVAN) injection 1 mg (1 mg Intravenous Given  12/24/22 1722)  levETIRAcetam (KEPPRA) 2,000 mg in sodium chloride 0.9 % 250 mL IVPB (0 mg Intravenous Stopped 12/24/22 2234)  topiramate (TOPAMAX) tablet 50 mg (50 mg Oral Given 12/24/22 2016)  sodium chloride 0.9 % bolus 1,000 mL (0 mLs Intravenous Stopped 12/24/22 2234)  ketorolac (TORADOL) 30 MG/ML injection 15 mg (15 mg Intravenous Given 12/24/22 2229)  acetaminophen (TYLENOL) tablet 1,000 mg (1,000 mg Oral Given 12/24/22 2228)     IMPRESSION / MDM / ASSESSMENT AND PLAN / ED COURSE  I reviewed the triage vital signs and the nursing notes.                              Differential diagnosis includes, but is not limited to, breakthrough seizure, seizure-like activity, intracranial hemorrhage, intracranial mass,  Patient's presentation is most consistent with acute presentation with potential threat to life or bodily function.  The patient is on the cardiac monitor to evaluate for evidence of arrhythmia and/or  significant heart rate changes   22 yo F here with seizure likely 2/2 running out of her home medications. She reportedly cannot afford them after losing insurance. Pt is back to baseline here. Imaging shows no fx or injury. Labs reassuring. Discussed case with Neurologist on call, who recommends starting on Keppra 750 BID (which should be more affordable; pt has tolerated before) and continuing her Topamax as she takes this for headaches as well. Pt provided with GoodRx discounts and states she can afford this regimen. She was loaded with Keppra here.   FINAL CLINICAL IMPRESSION(S) / ED DIAGNOSES   Final diagnoses:  None     Rx / DC Orders   ED Discharge Orders          Ordered    levETIRAcetam (KEPPRA) 750 MG tablet  2 times daily,   Status:  Discontinued        12/24/22 2213    topiramate (TOPAMAX) 50 MG tablet  2 times daily,   Status:  Discontinued        12/24/22 2213    levETIRAcetam (KEPPRA) 750 MG tablet  2 times daily        12/24/22 2239    topiramate (TOPAMAX) 50 MG tablet  2 times daily        12/24/22 2239             Note:  This document was prepared using Dragon voice recognition software and may include unintentional dictation errors.   Shaune Pollack, MD 12/25/22 [phone]
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
## 2023-01-29 ENCOUNTER — Encounter (INDEPENDENT_AMBULATORY_CARE_PROVIDER_SITE_OTHER): Payer: Self-pay

## 2023-01-31 NOTE — Progress Notes (Unsigned)
This is a Pediatric Specialist E-Visit consult/follow up provided via My Chart Video Visit (Caregility). Cynthia [name] consented to an E-Visit consult today.  Is the patient present for the video visit? Yes Location of patient: [place] is at home. Is the patient located in the state of [place]? Yes Location of provider: [name], NP-C is remote  The following participants were involved in this E-Visit: CMA, NP, patient  This visit was done via VIDEO   Chief Complain/ Reason for E-Visit today: seizures Total time on call: 20 min Follow up: 6 weeks   Cynthia [name]   MRN:  [id number]  [birth year]   Provider: [name] NP-C Location of Care: [facility]  Visit type: Return visit  Last visit: 05/28/2022  Referral source: Pcp, No History from: Epic chart and patient  Brief history:  Copied from previous record: History of primary generalized epilepsy, migraines, panic and anxiety. She is taking Lamotrigine ER and Clonazepam for seizures, and Fluoxetine and Hydroxyzine for her mood disorder. Her last seizure occurred 01/14/20 in the setting of sleep deprivation. She has been seen by [facility] for anxiety and panic, which is typically triggered by disagreements with her parents. She also has some anxiety in crowds. She is taking and tolerating Topiramate for migraine prevention. She has been hospitalized for overdose attempts with the most recent being in October 2020. She was being followed by [facility] for eating disorder.   Today's concerns: She was seen in the ED on 12/24/2022 for a seizure in the setting of missed medication x 3 weeks. She says that she fell forward while sitting and struck a doorway. She had bruising to her cheek bones, nose and upper lip. She also had avulsion of one finger nail. She was changed from Lamotrigine ER to to Levetiracetam for affordability at  that time.  Cynthia [name] reports that she had been seizure free since 2021 until that seizure occurred. She reports that she had been living in [place] but moved back to [place] in August. She has had problems getting Medicaid re-established since the move and that is how she ran out of medication. She spoke with a caseworker today and is hopeful that the problem will be resolved.  Cynthia [name] is currently living with her sister but says that is a temporary arrangement. 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  She has had problems with mood in the past but reports today that other than financial stressors that her mood is good. Cynthia [name] reports that she has lost some weight and has been otherwise generally healthy since she was last seen. No health concerns today other than previously mentioned.  Review of systems: Please see HPI for neurologic and other pertinent review of systems. Otherwise all other systems were reviewed and were negative.  Problem List: Patient Active Problem List   Diagnosis Date Noted   Class 3 severe obesity due to excess calories without serious comorbidity with body mass index (BMI) of 50.0 to 59.9 in adult [facility]) 01/11/2022   Pain of left hip 07/01/2021   Mood disorder in conditions classified elsewhere 07/01/2021   Gastroesophageal reflux disease 05/11/2021   Weight gain 11/30/2020   Eating disorder 11/30/2020   History of depression 04/28/2020   History of anxiety 04/28/2020   Intentional drug overdose (HCC)    Severe episode of recurrent major depressive disorder, without  psychotic features (HCC) 07/14/2018   Migraine without aura and without status migrainosus, not intractable 05/27/2018   Panic attacks 12/31/2016   Frequent headaches 03/16/2016   Nonintractable juvenile myoclonic epilepsy without status epilepticus (HCC) 04/18/2015     Past Medical History:  Diagnosis Date   Anxiety     Phreesia 09/11/2019   Depression    Phreesia 04/25/2020   Headache    Intentional SSRI (selective serotonin reuptake inhibitor) overdose, initial encounter (HCC) 01/10/2019   Overdose of benzodiazepine, intentional self-harm, initial encounter (HCC) 01/10/2019   Seizures (HCC)    Phreesia 09/11/2019   Vision abnormalities     Past medical history comments: See HPI Copied from previous record: She took Keppra in the past but was changed to Trokendi XR in 2017 because of ongoing seizures and for migraine prevention. An EEG performed January 08, 2017 was normal. 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 An EEG done on February 07, 2018 was normal awake and asleep. Keppra was restarted in 2019 because of seizure activity but stopped due to side effects   Surgical history: Past Surgical History:  Procedure Laterality Date   TONSILLECTOMY Bilateral 2007   Performed at [facility]   TYMPANOSTOMY TUBE PLACEMENT Bilateral 2004   Performed at [facility]     Family history: family history includes Anxiety disorder in her maternal aunt, maternal grandmother, and mother; Autism in an other family member; Bipolar disorder in her maternal aunt, maternal grandmother, and mother; Depression in her maternal aunt, maternal grandmother, and mother; Hypertension in her father, maternal grandfather, maternal grandmother, mother, paternal grandfather, and paternal grandmother; Lung cancer in her paternal grandfather; Migraines in her maternal aunt, maternal grandfather, maternal grandmother, and mother; Seizures in an other family member.   Social history: Social History   Socioeconomic History   Marital status: Single    Spouse name: Not on file   Number of children: Not on file   Years of education: Not on file   Highest education level: Not on file  Occupational History   Not on file  Tobacco Use   Smoking status: Smoker, Current Status Unknown    Types: E-cigarettes    Smokeless tobacco: Never  Vaping Use   Vaping status: Never Used  Substance and Sexual Activity   Alcohol use: Yes   Drug use: Yes    Types: Marijuana   Sexual activity: Yes    Birth control/protection: Implant  Other Topics Concern   Not on file  Social History Narrative   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   Lives with her boyfriend       Social Determinants of Corporate investment banker Strain: Not on [employer] Insecurity: Not on file  Transportation Needs: Not on file  Physical Activity: Not on file  Stress: Not on file  Social Connections: Not on file  Intimate Partner Violence: Not on file    Past/failed meds: Copied from previous record: Trokendi XR - improved headaches, later changed to immediate release Topiramate Lamotrigine - side effects - has tolerated Lamotrigine ER well  Allergies: Allergies  Allergen Reactions   Meloxicam Other (See Comments)    Caused mood swings   Tape Dermatitis    When regular hospital tape applied to arm, swelling and redness developed at the site. Where paper tape applied, no reaction noted.     Immunizations: Immunization History  Administered Date(s) Administered  Influenza,inj,Quad PF,6+ Mos 01/12/2019   Moderna Sars-Covid-2 Vaccination 02/04/2020   Pfizer Covid-19 Vaccine Bivalent Booster 52yrs & up 04/29/2021    Diagnostics/Screenings: Copied from previous record: rEEG 02/07/18 - normal awake and asleep. Regino Schultze, MD   rEEG 02/19/16 - abnormal with the patient awake, drowsy and asleep. The presence of generalized spike and wave discharge is consistent with her primary generalized epilepsy and raises the risk of recurrent seizures. Regino Schultze, MD  Physical Exam: Wt 297 lb (134.7 kg) Comment: Last Known Weight: Approx 2 weeks ago.  BMI 49.42 kg/m   General: well developed, well nourished obese young woman, seated at home, in no evident distress Head:  normocephalic and atraumatic. No dysmorphic features. Neck: supple Musculoskeletal: No skeletal deformities or obvious scoliosis Skin: no rashes or neurocutaneous lesions  Neurologic Exam Mental Status: Awake and fully alert.  Attention span, concentration, and fund of knowledge appropriate for age.  Speech fluent without dysarthria.  Able to follow commands and participate in examination. Cranial Nerves: Turns to localize faces, objects and sounds in the periphery. Facial sensation intact.  Face, tongue, palate move normally and symmetrically. Motor: Normal functional bulk, tone and strength Sensory: Intact to touch and temperature in all extremities. Coordination: No dysmetria when reaching for objects Gait and Station: Arises from chair, without difficulty. Stance is normal.  Gait demonstrates normal stride length and balance.   Impression: Nonintractable juvenile myoclonic epilepsy without status epilepticus (HCC) - Plan: levETIRAcetam (KEPPRA) 750 MG tablet  Mood disorder in conditions classified elsewhere  Does not have health insurance   Recommendations for plan of care: The patient's previous Epic records were reviewed. No recent diagnostic studies to be reviewed with the patient. I talked with Cynthia [name] today about the seizure medication and explained that the Levetiracetam would be cheaper for her to purchase while she is without insurance. She agreed to stay on Levetiracetam for now.  Plan until next visit: Continue medications as prescribed  Call for questions or concerns Cynthia [name] will let me know when Medicaid has been reinstated. I will plan to see her in December to follow up.   The medication list was reviewed and reconciled. No changes were made in the prescribed medications today. A complete medication list was provided to the patient.  Allergies as of 02/01/2023       Reactions   Meloxicam Other (See Comments)   Caused mood swings   Tape Dermatitis   When regular  hospital tape applied to arm, swelling and redness developed at the site. Where paper tape applied, no reaction noted.         Medication List        Accurate as of February 01, 2023 11:59 PM. If you have any questions, ask your nurse or doctor.          cetirizine 10 MG tablet Commonly known as: ZYRTEC TAKE 1 TABLET BY MOUTH EVERY DAY   FLUoxetine 20 MG capsule Commonly known as: PROZAC Take 2 capsules per day   fluticasone 50 MCG/ACT nasal spray Commonly known as: FLONASE Place into both nostrils.   hydrOXYzine 25 MG tablet Commonly known as: ATARAX TAKE 1 TABLET BY MOUTH EVERY DAY AT BEDTIME AS NEEDED FOR ANXIETY   ibuprofen 800 MG tablet Commonly known as: ADVIL Take 800 mg by mouth every 8 (eight) hours as needed.   levETIRAcetam 750 MG tablet Commonly known as: Keppra Take 1 tablet (750 mg total) by mouth 2 (two) times daily.   MELATONIN PO Take 1  tablet by mouth at bedtime as needed (sleep).   Nexplanon 68 MG Impl implant Generic drug: etonogestrel 1 each by Subdermal route once.   ondansetron 4 MG tablet Commonly known as: ZOFRAN Take 1 tablet (4 mg total) by mouth every 6 (six) hours.   pantoprazole 40 MG tablet Commonly known as: PROTONIX TAKE 1 TABLET BY MOUTH EVERY DAY   topiramate 50 MG tablet Commonly known as: Topamax Take 1 tablet (50 mg total) by mouth 2 (two) times daily.      Total time spent with the patient was 20 minutes, of which 50% or more was spent in counseling and coordination of care.  [name] NP-C [facility] [address], [address] [place], [place] [address] Ph. [phone] Fax [phone]

## 2023-01-31 NOTE — Patient Instructions (Signed)
It was a pleasure to see you today!  Instructions for you until your next appointment are as follows: Continue taking the Levetiracetam (Keppra) 750mg  in the morning and at night.  Let me know if more seizures occur Send a MyChart message when your Medicaid is re-instated  Please plan to return for follow up in December or sooner if needed.  Feel free to contact our office during normal business hours at [phone] with questions or concerns. If there is no answer or the call is outside business hours, please leave a message and our clinic staff will call you back within the next business day.  If you have an urgent concern, please stay on the line for our after-hours answering service and ask for the on-call neurologist.     I also encourage you to use MyChart to communicate with me more directly. If you have not yet signed up for MyChart within [facility], the front desk staff can help you. However, please note that this inbox is NOT monitored on nights or weekends, and response can take up to 2 business days.  Urgent matters should be discussed with the on-call pediatric neurologist.   At [facility], we are committed to providing exceptional care. You will receive a patient satisfaction survey through text or email regarding your visit today. Your opinion is important to me. Comments are appreciated.

## 2023-02-01 ENCOUNTER — Encounter (INDEPENDENT_AMBULATORY_CARE_PROVIDER_SITE_OTHER): Payer: Self-pay | Admitting: Family

## 2023-02-01 ENCOUNTER — Telehealth (INDEPENDENT_AMBULATORY_CARE_PROVIDER_SITE_OTHER): Payer: Self-pay | Admitting: Family

## 2023-02-01 VITALS — Wt 297.0 lb

## 2023-02-01 DIAGNOSIS — G40B09 Juvenile myoclonic epilepsy, not intractable, without status epilepticus: Secondary | ICD-10-CM

## 2023-02-01 DIAGNOSIS — F0630 Mood disorder due to known physiological condition, unspecified: Secondary | ICD-10-CM | POA: Diagnosis not present

## 2023-02-01 DIAGNOSIS — Z5971 Insufficient health insurance coverage: Secondary | ICD-10-CM | POA: Diagnosis not present

## 2023-02-01 MED ORDER — LEVETIRACETAM 750 MG PO TABS: 750.0000 mg | ORAL_TABLET | Freq: Two times a day (BID) | ORAL | 1 refills | Status: DC

## 2023-02-02 ENCOUNTER — Encounter (INDEPENDENT_AMBULATORY_CARE_PROVIDER_SITE_OTHER): Payer: Self-pay | Admitting: Family

## 2023-02-02 DIAGNOSIS — Z5971 Insufficient health insurance coverage: Secondary | ICD-10-CM | POA: Insufficient documentation

## 2023-04-15 ENCOUNTER — Encounter (INDEPENDENT_AMBULATORY_CARE_PROVIDER_SITE_OTHER): Payer: Self-pay

## 2023-04-19 ENCOUNTER — Ambulatory Visit: Payer: Medicaid Other

## 2023-04-19 DIAGNOSIS — R300 Dysuria: Secondary | ICD-10-CM | POA: Diagnosis not present

## 2023-04-19 LAB — POCT URINALYSIS DIPSTICK

## 2023-04-19 MED ORDER — NITROFURANTOIN MONOHYD MACRO 100 MG PO CAPS
100.0000 mg | ORAL_CAPSULE | Freq: Two times a day (BID) | ORAL | 0 refills | Status: DC
Start: 2023-04-19 — End: 2023-05-03

## 2023-04-19 NOTE — Progress Notes (Signed)
SUBJECTIVE:  23 y.o. female complains of dysuria urinary frequency 1-2 day(s)  Denies abnormal vaginal bleeding or significant pelvic pain or fever.Denies history of known exposure to STD.  No LMP recorded. Patient has had an implant.  OBJECTIVE:  She appears alert, well appearing, in no apparent distress Urine dipstick: positive for leukocytes and positive for blood   ASSESSMENT:  Dysuria    PLAN:  urine culture sent to lab. Treatment: Macrobid sent in per protocol  ROV prn if symptoms persist or worsen.

## 2023-04-22 LAB — URINE CULTURE

## 2023-04-23 ENCOUNTER — Encounter: Payer: Self-pay | Admitting: Obstetrics and Gynecology

## 2023-04-23 MED ORDER — SULFAMETHOXAZOLE-TRIMETHOPRIM 800-160 MG PO TABS: 1.0000 | ORAL_TABLET | Freq: Two times a day (BID) | ORAL | 0 refills | Status: DC

## 2023-04-23 NOTE — Addendum Note (Signed)
Addended by: [place] Bing on: 04/23/2023 10:55 AM   Modules accepted: Orders

## 2023-04-26 ENCOUNTER — Encounter: Payer: Self-pay | Admitting: Obstetrics and Gynecology

## 2023-04-28 ENCOUNTER — Emergency Department
Admission: EM | Admit: 2023-04-28 | Discharge: 2023-04-28 | Disposition: A | Discharge status: Home / Self Care | Payer: Medicaid Other | Attending: Emergency Medicine | Admitting: Emergency Medicine

## 2023-04-28 ENCOUNTER — Encounter: Payer: Self-pay | Admitting: *Deleted

## 2023-04-28 ENCOUNTER — Other Ambulatory Visit: Payer: Self-pay

## 2023-04-28 DIAGNOSIS — N9489 Other specified conditions associated with female genital organs and menstrual cycle: Secondary | ICD-10-CM | POA: Insufficient documentation

## 2023-04-28 DIAGNOSIS — Z5321 Procedure and treatment not carried out due to patient leaving prior to being seen by health care provider: Secondary | ICD-10-CM | POA: Insufficient documentation

## 2023-04-28 DIAGNOSIS — L0291 Cutaneous abscess, unspecified: Secondary | ICD-10-CM | POA: Insufficient documentation

## 2023-04-28 HISTORY — DX: Obesity, unspecified: E66.9

## 2023-04-28 NOTE — ED Notes (Signed)
Checked pt in triage, spot not visible (not to surface)

## 2023-04-28 NOTE — ED Triage Notes (Signed)
Pt states that when she went to bathroom and wiped she noted a painful bump in her groin, she states  this thinks it is an abscess, it is not draining.  No fever or chills.

## 2023-04-28 NOTE — ED Notes (Signed)
Patient called x3 with no answer from the lobby.

## 2023-05-03 ENCOUNTER — Other Ambulatory Visit (HOSPITAL_COMMUNITY)
Admission: RE | Admit: 2023-05-03 | Discharge: 2023-05-03 | Disposition: A | Discharge status: Home / Self Care | Payer: Medicaid Other | Source: Ambulatory Visit | Attending: Obstetrics and Gynecology | Admitting: Obstetrics and Gynecology

## 2023-05-03 ENCOUNTER — Ambulatory Visit (INDEPENDENT_AMBULATORY_CARE_PROVIDER_SITE_OTHER): Payer: Medicaid Other | Admitting: Obstetrics and Gynecology

## 2023-05-03 VITALS — BP 140/89 | HR 96 | Wt 273.0 lb

## 2023-05-03 DIAGNOSIS — R300 Dysuria: Secondary | ICD-10-CM

## 2023-05-03 DIAGNOSIS — Z124 Encounter for screening for malignant neoplasm of cervix: Secondary | ICD-10-CM | POA: Diagnosis not present

## 2023-05-03 DIAGNOSIS — N898 Other specified noninflammatory disorders of vagina: Secondary | ICD-10-CM

## 2023-05-03 DIAGNOSIS — F419 Anxiety disorder, unspecified: Secondary | ICD-10-CM | POA: Diagnosis not present

## 2023-05-03 DIAGNOSIS — F332 Major depressive disorder, recurrent severe without psychotic features: Secondary | ICD-10-CM

## 2023-05-03 DIAGNOSIS — Z01419 Encounter for gynecological examination (general) (routine) without abnormal findings: Secondary | ICD-10-CM | POA: Diagnosis not present

## 2023-05-03 DIAGNOSIS — Q524 Other congenital malformations of vagina: Secondary | ICD-10-CM

## 2023-05-03 MED ORDER — FLUOXETINE HCL 20 MG PO TABS: ORAL_TABLET | ORAL | 11 refills | Status: DC

## 2023-05-03 NOTE — Progress Notes (Signed)
   RETURN GYNECOLOGY VISIT  Subjective:  Cynthia [name] is a 23 y.o. G0P0000 with Nexplanon in place presenting for multiple concerns  - Vulvar itching & burning - dx with UTI a couple of weeks ago, got a little better with abx then worse again. Itching & burning is always present, not just with urination.  - Vulvar lump - noticed a lump in the vulva while wiping recently, non painful - Lower abdominal cramping - for past few days, feels like she is going to get her period but has been amenorrheic with nexplanon  - Is interested in removing her nexplanon to see if it helps with her mood; is interested in other methods for contraception. She also wonders if she has PCOS, notes more hair growth under her chin. Cycles were regular before nexplanon was inserted.  - Notes low mood and struggling with depression. Was previously on fluoxetine but had issues with her insurance so she hasn't been taking it. Is looking for a therapist. Denies SI/HI  Sexually active with female partner  Denies fevers/chills, nausea/vomiting. Notes decreased appetite that she attributes to anxiety/depression  Objective:   Vitals:   05/03/23 1446  BP: (!) 140/89  Pulse: 96  Weight: 273 lb (123.8 kg)   General:  Alert, oriented and cooperative. Patient is in no acute distress.  Skin: Skin is warm and dry. No rash noted.   Cardiovascular: Normal heart rate noted  Respiratory: Normal respiratory effort, no problems with respiration noted  Abdomen: Soft, non-tender, non-distended   Pelvic: NEFG. Small, mobile cyst <1cm in the left labia minora slightly inferior to the urethra, no apparent connection. Non tender.  Otherwise normal appearance of cervix, vagina, no abnormal discharge noted. No CMT, uterus or adnexal tenderness. No adnexal masses palpated. Limited by habitus.   Exam performed in the presence of a chaperone  Assessment and Plan:  Cynthia [name] is a 23 y.o. with the  following:  Dysuria Itching in the vaginal area -     Cervicovaginal ancillary only( [place]) -     Urinalysis, Routine w reflex microscopic -     Urine Culture -     Cervicovaginal ancillary only( [place])  Cervical cancer screening -     Cytology - PAP  Gartner duct, cyst Discussed suspicion that this is a small gartner duct cyst. Reviewed etiology of gartner duct cyst. Given small size and lack of symptoms, would not recommend removal at this time. Pt agreeable to expectant management.   Severe episode of recurrent major depressive disorder, without psychotic features (HCC) Anxiety Prozac is on her insurance tier 1 med list. Offered to resend rx, but would ramp up as she has not been on it in several months (was on 40mg  daily). She is agreeable to new rx to see if it is now affordable Discussed potential to precipitate anxiety or cause/worsen suicidal ideations. Reviewed resources and recommended prompt ED evaluation if SI occurs.  -     FLUoxetine (PROZAC) 20 MG tablet; Take 0.5 tablets (10 mg total) by mouth daily for 7 days, THEN 1 tablet (20 mg total) daily.  Return in 4 weeks (on 05/31/2023), or to discuss birth control options and/or for annual exam.  Future Appointments  Date Time Provider Department Center  05/18/2023  8:15 AM  Bing, MD [facility]    Lennart Pall, MD

## 2023-05-03 NOTE — Progress Notes (Signed)
RGYN pt here for problem visit onset last week pt has noticed an painful lump/bump in vaginal area also reports UTI symptoms still. Had UTI already completed meds.   Pt advised will need to leave sample.

## 2023-05-03 NOTE — Patient Instructions (Signed)
 [email or web address]

## 2023-05-04 LAB — URINALYSIS, ROUTINE W REFLEX MICROSCOPIC
Bilirubin, UA: NEGATIVE
Glucose, UA: NEGATIVE
Ketones, UA: NEGATIVE
Nitrite, UA: NEGATIVE
Specific Gravity, UA: 1.02 (ref 1.005–1.030)
Urobilinogen, Ur: 1 mg/dL (ref 0.2–1.0)
pH, UA: 7 (ref 5.0–7.5)

## 2023-05-04 LAB — MICROSCOPIC EXAMINATION: Casts: NONE SEEN /[LPF]

## 2023-05-05 ENCOUNTER — Encounter: Payer: Self-pay | Admitting: Obstetrics and Gynecology

## 2023-05-05 LAB — CERVICOVAGINAL ANCILLARY ONLY
Bacterial Vaginitis (gardnerella): NEGATIVE
Candida Glabrata: NEGATIVE
Candida Vaginitis: POSITIVE — AB
Chlamydia: NEGATIVE
Comment: NEGATIVE
Comment: NEGATIVE
Comment: NEGATIVE
Comment: NEGATIVE
Comment: NEGATIVE
Comment: NORMAL
Neisseria Gonorrhea: NEGATIVE
Trichomonas: NEGATIVE

## 2023-05-05 MED ORDER — FLUCONAZOLE 150 MG PO TABS
150.0000 mg | ORAL_TABLET | Freq: Once | ORAL | 0 refills | Status: AC
Start: 2023-05-05 — End: 2023-05-05

## 2023-05-06 ENCOUNTER — Other Ambulatory Visit: Payer: Self-pay | Admitting: *Deleted

## 2023-05-06 MED ORDER — SULFAMETHOXAZOLE-TRIMETHOPRIM 800-160 MG PO TABS: 1.0000 | ORAL_TABLET | Freq: Two times a day (BID) | ORAL | 0 refills | Status: AC

## 2023-05-06 MED ORDER — SULFAMETHOXAZOLE-TRIMETHOPRIM 800-160 MG PO TABS: 1.0000 | ORAL_TABLET | Freq: Two times a day (BID) | ORAL | 0 refills | Status: DC

## 2023-05-06 NOTE — Progress Notes (Signed)
Sent to wrong pharmacy, will resend

## 2023-05-06 NOTE — Progress Notes (Signed)
 Pt now requesting RX sent to [facility] as it will be cheaper

## 2023-05-07 LAB — URINE CULTURE

## 2023-05-10 ENCOUNTER — Encounter: Payer: Self-pay | Admitting: Obstetrics and Gynecology

## 2023-05-10 LAB — CYTOLOGY - PAP: Diagnosis: NEGATIVE

## 2023-05-18 ENCOUNTER — Ambulatory Visit: Payer: Medicaid Other | Admitting: Obstetrics and Gynecology

## 2023-05-20 ENCOUNTER — Encounter (INDEPENDENT_AMBULATORY_CARE_PROVIDER_SITE_OTHER): Payer: Self-pay

## 2023-05-21 ENCOUNTER — Encounter: Payer: Self-pay | Admitting: Obstetrics and Gynecology

## 2023-05-24 MED ORDER — FLUOXETINE HCL 20 MG PO TABS: ORAL_TABLET | ORAL | 11 refills | Status: DC

## 2023-06-24 ENCOUNTER — Encounter: Payer: Self-pay | Admitting: Obstetrics and Gynecology

## 2023-06-24 ENCOUNTER — Ambulatory Visit (INDEPENDENT_AMBULATORY_CARE_PROVIDER_SITE_OTHER): Admitting: Obstetrics and Gynecology

## 2023-06-24 VITALS — BP 130/94 | HR 83 | Wt 264.0 lb

## 2023-06-24 DIAGNOSIS — Z3046 Encounter for surveillance of implantable subdermal contraceptive: Secondary | ICD-10-CM | POA: Diagnosis not present

## 2023-06-24 NOTE — Procedures (Signed)
 Nexplanon Removal Procedure Note Patient has been on nexplanon since teen years and would like to have it out. She is no longer sexually active and wants to see how her body is without it.   After informed consent was obtained, the patient's left arm was examined and the Nexplanon rod was noted to be easily palpable. The area was cleaned with alcohol then local anesthesia was infiltrated with 6 ml of 1% lidocaine with epinephrine. The area was prepped with betadine. Using sterile technique, an 11 blade was used to make an incision, and the Nexplanon device was brought to the incision site. The capsule was scrapped off with the scalpel, the Nexplanon grasped with hemostats, and easily removed; the removal site was hemostatic. The Nexplanon was inspected and noted to be intact.  A steri-strip and a pressure dressing was applied.  The patient tolerated the procedure well.  Cornelia Copa MD Attending [facility] for [facility])

## 2023-06-24 NOTE — Progress Notes (Signed)
 CC: nexplanon removal

## 2023-07-11 ENCOUNTER — Encounter: Payer: Self-pay | Admitting: Obstetrics and Gynecology

## 2023-07-12 ENCOUNTER — Other Ambulatory Visit: Payer: Self-pay | Admitting: *Deleted

## 2023-07-12 MED ORDER — FLUOXETINE HCL 20 MG PO TABS: ORAL_TABLET | ORAL | 11 refills | Status: DC

## 2023-07-12 NOTE — Progress Notes (Signed)
 Pt requesting this RX to be sent to another pharmacy for cheaper price

## 2023-07-15 ENCOUNTER — Encounter (INDEPENDENT_AMBULATORY_CARE_PROVIDER_SITE_OTHER): Payer: Self-pay

## 2023-07-19 MED ORDER — LAMOTRIGINE ER 300 MG PO TB24: 300.0000 mg | ORAL_TABLET | Freq: Every evening | ORAL | 0 refills | Status: DC

## 2023-07-19 NOTE — Telephone Encounter (Signed)
 Scheduled

## 2023-07-22 ENCOUNTER — Encounter (INDEPENDENT_AMBULATORY_CARE_PROVIDER_SITE_OTHER): Payer: Self-pay | Admitting: Family

## 2023-07-22 ENCOUNTER — Telehealth (INDEPENDENT_AMBULATORY_CARE_PROVIDER_SITE_OTHER): Payer: Self-pay | Admitting: Family

## 2023-07-22 VITALS — Wt 264.0 lb

## 2023-07-22 DIAGNOSIS — F410 Panic disorder [episodic paroxysmal anxiety] without agoraphobia: Secondary | ICD-10-CM | POA: Diagnosis not present

## 2023-07-22 DIAGNOSIS — G40B09 Juvenile myoclonic epilepsy, not intractable, without status epilepticus: Secondary | ICD-10-CM

## 2023-07-22 DIAGNOSIS — Z8659 Personal history of other mental and behavioral disorders: Secondary | ICD-10-CM | POA: Diagnosis not present

## 2023-07-22 MED ORDER — LAMOTRIGINE ER 300 MG PO TB24: 300.0000 mg | ORAL_TABLET | Freq: Every evening | ORAL | 0 refills | Status: DC

## 2023-07-22 MED ORDER — FLUOXETINE HCL 20 MG PO TABS: ORAL_TABLET | ORAL | 5 refills | Status: DC

## 2023-07-22 NOTE — Progress Notes (Unsigned)
 This is a Pediatric Specialist E-Visit consult/follow up provided via My Chart Video Visit (Caregility). Cynthia [name] consented to an E-Visit consult today.  Is the patient present for the video visit? Yes Location of patient: Cynthia [name] is at home Is the patient located in the state of [place] ? Yes Location of provider: Lyndol Santee, NP-C is at office   The following participants were involved in this E-Visit: CMA, NP and patient  This visit was done via VIDEO   Chief Complain/ Reason for E-Visit today: seizure follow up Total time on call: 20 min Follow up: 4 months   Cynthia [name]   MRN:  [id number]  11-Apr-2000   Provider: Lyndol Santee NP-C Location of Care: [facility]  Visit type: Return visit  Last visit: 02/01/2023  Referral source: Pcp, No History from: Epic chart and patient  Brief history:  Copied from previous record: History of primary generalized epilepsy, migraines, panic and anxiety. She is taking Lamotrigine  ER for seizures. She has been seen by [facility] for anxiety and panic, which is typically triggered by disagreements with her parents. She also has some anxiety in crowds. She is taking and tolerating Fluoxetine  for mood. She has been hospitalized for overdose attempts as a teen, with the most recent being in October 2020.   Today's concerns: She has remained seizure free since her last visit. The last seizure occurred 12/24/2022 in the setting of missed medication. She was briefly changed to Levetiracetam  because of cost, but has changed back to Lamotrigine  ER.  Cynthia [name] reports that she is happy and doing well at this time. She had problems with mood as an adolescent. She has lost weight by intention. She has a steady boyfriend who lives in another state. 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  Cynthia [name] has been otherwise generally healthy since she was last seen.  No health concerns today other than previously mentioned.  Review of systems: Please see HPI for neurologic and other pertinent review of systems. Otherwise all other systems were reviewed and were negative.  Problem List: Patient Active Problem List   Diagnosis Date Noted   Does not have health insurance 02/02/2023   Class 3 severe obesity due to excess calories without serious comorbidity with body mass index (BMI) of 50.0 to 59.9 in adult (HCC) 01/11/2022   Pain of left hip 07/01/2021   Mood disorder in conditions classified elsewhere 07/01/2021   Gastroesophageal reflux disease 05/11/2021   Weight gain 11/30/2020   Eating disorder 11/30/2020   History of depression 04/28/2020   History of anxiety 04/28/2020   Intentional drug overdose (HCC)    Severe episode of recurrent major depressive disorder, without psychotic features (HCC) 07/14/2018   Migraine without aura and without status migrainosus, not intractable 05/27/2018   Panic attacks 12/31/2016   Frequent headaches 03/16/2016   Nonintractable juvenile myoclonic epilepsy without status epilepticus (HCC) 04/18/2015     Past Medical History:  Diagnosis Date   Anxiety    Phreesia 09/11/2019   Depression    Phreesia 04/25/2020   Headache    Intentional SSRI (selective serotonin reuptake inhibitor) overdose, initial encounter (HCC) 01/10/2019   Obesity    Overdose of benzodiazepine, intentional self-harm, initial encounter (HCC) 01/10/2019   Seizures (HCC)    Phreesia 09/11/2019   Vision abnormalities     Past medical history comments: See HPI Copied from previous record: She took Keppra  in the past but was changed to  Trokendi  XR in 2017 because of ongoing seizures and for migraine prevention. An EEG performed January 08, 2017 was normal. 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 An EEG done on February 07, 2018 was normal awake and asleep. Keppra  was restarted in 2019 because of  seizure activity but stopped due to side effects   Surgical history: Past Surgical History:  Procedure Laterality Date   REMOVAL OF IMPLANON  ROD  06/24/2023   TONSILLECTOMY Bilateral 2007   Performed at [facility]   TYMPANOSTOMY TUBE PLACEMENT Bilateral 2004   Performed at [facility]     Family history: family history includes Anxiety disorder in her maternal aunt, maternal grandmother, and mother; Autism in an other family member; Bipolar disorder in her maternal aunt, maternal grandmother, and mother; Depression in her maternal aunt, maternal grandmother, and mother; Hypertension in her father, maternal grandfather, maternal grandmother, mother, paternal grandfather, and paternal grandmother; Lung cancer in her paternal grandfather; Migraines in her maternal aunt, maternal grandfather, maternal grandmother, and mother; Seizures in an other family member.   Social history: Social History   Socioeconomic History   Marital status: Single    Spouse name: Not on file   Number of children: Not on file   Years of education: Not on file   Highest education level: Not on file  Occupational History   Not on file  Tobacco Use   Smoking status: Smoker, Current Status Unknown    Types: E-cigarettes   Smokeless tobacco: Never  Vaping Use   Vaping status: Never Used  Substance and Sexual Activity   Alcohol use: Yes   Drug use: Yes    Types: Marijuana   Sexual activity: Yes    Birth control/protection: Implant  Other Topics Concern   Not on file  Social History Narrative   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    She is working Psychologist, counselling.    Lives with her sister.       Social Drivers of Corporate investment banker Strain: Not on file  Food Insecurity: Not on file  Transportation Needs: Not on file  Physical Activity: Not on file  Stress: Not on file  Social Connections: Not on file  Intimate Partner Violence: Not on file    Past/failed meds: Copied from  previous record: Trokendi  XR - improved headaches, later changed to immediate release Topiramate  Lamotrigine  - side effects - has tolerated Lamotrigine  ER well  Allergies: Allergies  Allergen Reactions   Meloxicam Other (See Comments)    Caused mood swings   Tape Dermatitis    When regular hospital tape applied to arm, swelling and redness developed at the site. Where paper tape applied, no reaction noted.     Immunizations: Immunization History  Administered Date(s) Administered   Influenza,inj,Quad PF,6+ Mos 01/12/2019   Moderna Sars-Covid-2 Vaccination 02/04/2020   Pfizer Covid-19 Vaccine Bivalent Booster 75yrs & up 04/29/2021    Diagnostics/Screenings: Copied from previous record: rEEG 02/07/18 - normal awake and asleep. Carry Clapper, MD   rEEG 02/19/16 - abnormal with the patient awake, drowsy and asleep. The presence of generalized spike and wave discharge is consistent with her primary generalized epilepsy and raises the risk of recurrent seizures. Carry Clapper, MD  Physical Exam: Wt 264 lb (119.7 kg) Comment: Last known weight : 4.20.2025  BMI 43.93 kg/m   Wt Readings from Last 3 Encounters:  07/22/23 264 lb (119.7 kg)  06/24/23 264 lb (119.7 kg)  05/03/23 273 lb (123.8  kg)    General: Well developed, well nourished young woman, seated at her home, in no evident distress Head: Head normocephalic and atraumatic.  Neck: Supple Musculoskeletal: No obvious deformities or scoliosis Skin: No rashes or neurocutaneous lesions  Neurologic Exam Mental Status: Awake and fully alert.  Oriented to place and time.  Recent and remote memory intact.  Attention span, concentration, and fund of knowledge appropriate.  Mood and affect appropriate. Cranial Nerves: Turns to localize faces, objects and sounds in the periphery. Facial movements symmetrical Motor: Normal functional bulk, tone and strength Sensory: Intact to touch and temperature in all extremities.  Coordination: No  dysmetria with reach for objects Gait and Station: Normal gait  Impression: Nonintractable juvenile myoclonic epilepsy without status epilepticus (HCC) - Plan: LamoTRIgine  300 MG TB24 24 hour tablet  Panic attacks - Plan: FLUoxetine  (PROZAC ) 20 MG tablet  History of depression - Plan: FLUoxetine  (PROZAC ) 20 MG tablet  History of anxiety - Plan: FLUoxetine  (PROZAC ) 20 MG tablet   Recommendations for plan of care: The patient's previous Epic records were reviewed. No recent diagnostic studies to be reviewed with the patient. I talked with Cynthia [name] and reminded her of need for compliance with medication. We also talked about transferring her care to adult neurology provider at her next visit.  Plan until next visit: Continue medications as prescribed  Call for questions or concerns Return for follow up in August.  The medication list was reviewed and reconciled. No changes were made in the prescribed medications today. A complete medication list was provided to the patient.  Allergies as of 07/22/2023       Reactions   Meloxicam Other (See Comments)   Caused mood swings   Tape Dermatitis   When regular hospital tape applied to arm, swelling and redness developed at the site. Where paper tape applied, no reaction noted.         Medication List        Accurate as of July 22, 2023 11:59 PM. If you have any questions, ask your nurse or doctor.          STOP taking these medications    cetirizine  10 MG tablet Commonly known as: ZYRTEC  Stopped by: Lyndol Santee   fluticasone 50 MCG/ACT nasal spray Commonly known as: FLONASE Stopped by: Lyndol Santee   hydrOXYzine  25 MG tablet Commonly known as: ATARAX  Stopped by: Lyndol Santee   levETIRAcetam  750 MG tablet Commonly known as: Keppra  Stopped by: Lyndol Santee   pantoprazole  40 MG tablet Commonly known as: PROTONIX  Stopped by: Lyndol Santee   topiramate  50 MG tablet Commonly known as:  Topamax  Stopped by: Lyndol Santee       TAKE these medications    FLUoxetine  20 MG tablet Commonly known as: PROZAC  Take 1 tablet per day What changed: See the new instructions. Changed by: Lyndol Santee   LamoTRIgine  300 MG Tb24 24 hour tablet Take 1 tablet (300 mg total) by mouth at bedtime.      Total time spent with the patient was 20 minutes, of which 50% or more was spent in counseling and coordination of care.  Lyndol Santee NP-C [facility] [address], [address] [place], [place] [address] Ph. [phone] Fax [phone]

## 2023-07-22 NOTE — Patient Instructions (Signed)
 It was a pleasure to see you today!  Instructions for you until your next appointment are as follows: Continue your medications as prescribed.  Let me know if you have any seizures Please sign up for MyChart if you have not done so. Please plan to return for follow up in August or sooner if needed.  Feel free to contact our office during normal business hours at [phone] with questions or concerns. If there is no answer or the call is outside business hours, please leave a message and our clinic staff will call you back within the next business day.  If you have an urgent concern, please stay on the line for our after-hours answering service and ask for the on-call neurologist.     I also encourage you to use MyChart to communicate with me more directly. If you have not yet signed up for MyChart within [facility], the front desk staff can help you. However, please note that this inbox is NOT monitored on nights or weekends, and response can take up to 2 business days.  Urgent matters should be discussed with the on-call pediatric neurologist.   At [facility], we are committed to providing exceptional care. You will receive a patient satisfaction survey through text or email regarding your visit today. Your opinion is important to me. Comments are appreciated.

## 2023-07-23 ENCOUNTER — Encounter (INDEPENDENT_AMBULATORY_CARE_PROVIDER_SITE_OTHER): Payer: Self-pay | Admitting: Family

## 2023-08-31 ENCOUNTER — Encounter: Payer: Self-pay | Admitting: Obstetrics and Gynecology

## 2023-09-08 ENCOUNTER — Ambulatory Visit: Admitting: Certified Nurse Midwife

## 2023-09-08 ENCOUNTER — Encounter: Payer: Self-pay | Admitting: Certified Nurse Midwife

## 2023-09-08 VITALS — BP 122/85 | HR 99 | Wt 265.0 lb

## 2023-09-08 DIAGNOSIS — Z3202 Encounter for pregnancy test, result negative: Secondary | ICD-10-CM | POA: Diagnosis not present

## 2023-09-08 DIAGNOSIS — Z30017 Encounter for initial prescription of implantable subdermal contraceptive: Secondary | ICD-10-CM | POA: Diagnosis not present

## 2023-09-08 DIAGNOSIS — Z3009 Encounter for other general counseling and advice on contraception: Secondary | ICD-10-CM | POA: Diagnosis not present

## 2023-09-08 LAB — POCT URINE PREGNANCY: Preg Test, Ur: NEGATIVE

## 2023-09-08 MED ORDER — ETONOGESTREL 68 MG ~~LOC~~ IMPL
68.0000 mg | DRUG_IMPLANT | Freq: Once | SUBCUTANEOUS | Status: AC
Start: 2023-09-08 — End: 2023-09-08
  Administered 2023-09-08: 68 mg via SUBCUTANEOUS

## 2023-09-08 NOTE — Progress Notes (Signed)
 RGYN here to discuss contraception options.   Had Nexplanon  removed 06/24/23.  Pt states she will be visiting boyfriend / staying with him and wants to be sure she has birth control.  Has not been sexually active since Feb. Made aware will need UPT.

## 2023-09-09 NOTE — Progress Notes (Signed)
   GYNECOLOGY OFFICE VISIT NOTE  History:   Cynthia [name] is a 23 y.o. G0P0000 here today for birth control counseling. Patient recently had her Nexplanon  removed.  She wanted to see if it was affecting her hormones. Reports no change after removal but states that she she has sought therapy and has seen significant improvement. She states that she is planning to move to [place]  with her Boyfriend and desires to have it replaced. . She denies any abnormal vaginal discharge, bleeding, pelvic pain or other concerns.     Past Medical History:  Diagnosis Date   Anxiety    Phreesia 09/11/2019   Depression    Phreesia 04/25/2020   Headache    Intentional SSRI (selective serotonin reuptake inhibitor) overdose, initial encounter (HCC) 01/10/2019   Obesity    Overdose of benzodiazepine, intentional self-harm, initial encounter (HCC) 01/10/2019   Seizures (HCC)    Phreesia 09/11/2019   Vision abnormalities     Past Surgical History:  Procedure Laterality Date   REMOVAL OF IMPLANON  ROD  06/24/2023   TONSILLECTOMY Bilateral 2007   Performed at [facility]   TYMPANOSTOMY TUBE PLACEMENT Bilateral 2004   Performed at [facility]    The following portions of the patient's history were reviewed and updated as appropriate: allergies, current medications, past family history, past medical history, past social history, past surgical history and problem list.   Health Maintenance:  Normal pap and negative HRHPV on 05/03/2023.   Review of Systems:  Pertinent items noted in HPI and remainder of comprehensive ROS otherwise negative.  Physical Exam:  BP 122/85   Pulse 99   Wt 265 lb (120.2 kg)   LMP 08/22/2023 (Approximate)   BMI 44.10 kg/m  CONSTITUTIONAL: Well-developed, well-nourished female in no acute distress.  HEENT:  Normocephalic, atraumatic. External right and left ear normal. No scleral icterus.  NECK: Normal range of motion, supple, no masses noted on observation SKIN: No rash noted. Not  diaphoretic. No erythema. No pallor. MUSCULOSKELETAL: Normal range of motion. No edema noted. NEUROLOGIC: Alert and oriented to person, place, and time. Normal muscle tone coordination. No cranial nerve deficit noted. PSYCHIATRIC: Normal mood and affect. Normal behavior. Normal judgment and thought content. CARDIOVASCULAR: Normal heart rate noted RESPIRATORY: Effort and breath sounds normal, no problems with respiration noted ABDOMEN: No masses noted. No other overt distention noted.   PELVIC: Deferred  Labs and Imaging Results for orders placed or performed in visit on 09/08/23 (from the past week)  POCT urine pregnancy   Collection Time: 09/08/23  2:59 PM  Result Value Ref Range   Preg Test, Ur Negative Negative   No results found.    Assessment and Plan:    1. Birth control counseling (Primary) - R/B/A of birth control options. Patient desires to proceed with replacing her Nexplanon .  - POCT urine pregnancy Negative   2. Encounter for initial prescription of etonogestrel  contraceptive single-rod subdermal contraceptive implant - Nexplanon  placed (Please see procedure note for details.)    Routine preventative health maintenance measures emphasized. Please refer to After Visit Summary for other counseling recommendations.   Return in about 1 year (around 09/07/2024) for [place].    I spent 30 minutes dedicated to the care of this patient including pre-visit review of records, face to face time with the patient discussing her conditions and treatments and post visit orders.    Jubal Rademaker Maurie Southern) Marlys Singh, MSN, CNM  Center for [facility]  09/09/23 3:08 PM

## 2023-09-09 NOTE — Progress Notes (Signed)
 GYNECOLOGY CLINIC PROCEDURE NOTE  Ms. Cynthia [name] is a 23 y.o. G0P0000 here for Nexplanon  insertion. No GYN concerns.  Last pap smear was on 05/03/23 and was normal.  No other gynecologic concerns.  Nexplanon  Insertion Procedure Patient was given informed consent, she signed consent form.  Patient does understand that irregular bleeding is a very common side effect of this medication. She was advised to have backup contraception for one week after placement. Pregnancy test in clinic today was negative.  Appropriate time out taken.  Patient's left arm was prepped and draped in the usual sterile fashion.. The ruler used to measure and mark insertion area.  Patient was prepped with alcohol swab and then injected with 3 ml of 2% lidocaine .  She was prepped with betadine, Nexplanon  removed from packaging,  Device confirmed in needle, then inserted full length of needle and withdrawn per handbook instructions. Nexplanon  was able to palpated in the patient's arm; patient palpated the insert herself. There was minimal blood loss.  Patient insertion site covered with guaze and a pressure bandage to reduce any bruising.  The patient tolerated the procedure well and was given post procedure instructions.   Exp 2027-06 Lot: [id number]   Tari Fare, CNM 09/09/2023 3:18 PM

## 2023-09-14 ENCOUNTER — Ambulatory Visit: Admitting: Obstetrics and Gynecology

## 2023-10-11 ENCOUNTER — Encounter (INDEPENDENT_AMBULATORY_CARE_PROVIDER_SITE_OTHER): Payer: Self-pay

## 2023-10-11 DIAGNOSIS — G40B09 Juvenile myoclonic epilepsy, not intractable, without status epilepticus: Secondary | ICD-10-CM

## 2023-10-11 DIAGNOSIS — F410 Panic disorder [episodic paroxysmal anxiety] without agoraphobia: Secondary | ICD-10-CM

## 2023-10-11 DIAGNOSIS — Z8659 Personal history of other mental and behavioral disorders: Secondary | ICD-10-CM

## 2023-10-11 MED ORDER — LAMOTRIGINE ER 300 MG PO TB24: 300.0000 mg | ORAL_TABLET | Freq: Every evening | ORAL | 5 refills | Status: DC

## 2023-10-11 MED ORDER — FLUOXETINE HCL 20 MG PO TABS
ORAL_TABLET | ORAL | 5 refills | Status: DC
Start: 2023-10-11 — End: 2023-11-15

## 2023-11-14 NOTE — Progress Notes (Unsigned)
 This is a Pediatric Specialist E-Visit consult/follow up provided via My Chart Video Visit (Caregility). Philisha Iriah Cwik consented to an E-Visit consult today.  Is the patient present for the video visit? yes Location of patient: Earsie is at home  Is the patient located in the state of [place] ? no Location of provider: Ellouise Bollman, NP-C is at office  The following participants were involved in this E-Visit: CMA, NP, patient   This visit was done via VIDEO   Chief Complain/ Reason for E-Visit today: seizure follow up Total time on call: 15 minutes Follow up: December 2025   Xzandria Clevinger   MRN:  [id number]  04-24-2000   Provider: Ellouise Bollman NP-C Location of Care: [facility]  Visit type: Return visit  Last visit: 07/22/2023  Referral source: Pcp, No History from: Epic chart and patient  Brief history:  Copied from previous record: History of primary generalized epilepsy, migraines, panic and anxiety. She is taking Lamotrigine  ER for seizures. She has [facility] by [facility] for anxiety and panic, which is typically triggered by disagreements with her parents. She also has some anxiety in crowds. She is taking and tolerating Fluoxetine  for mood. She has been hospitalized for overdose attempts as a teen, with the most recent being in October 2020.    Today's concerns: Dariyah reports today that she has moved to [place]  since her last visit. She says that she is in a new relationship, is happy and has a job with [employer]. 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  She reports that she has remained seizure free. She and her boyfriend work day shift and have a regular schedule, so her sleep has been better.  She has lost some weight and feels better.  She has not yet gotten established with a PCP in [place]  but plans to do so.  Joell  has been otherwise generally healthy since she was last seen. No health concerns today other than previously mentioned.  Review of systems: Please see HPI for neurologic and other pertinent review of systems. Otherwise all other systems were reviewed and were negative.  Problem List: Patient Active Problem List   Diagnosis Date Noted   Does not have health insurance 02/02/2023   Class 3 severe obesity due to excess calories without serious comorbidity with body mass index (BMI) of 50.0 to 59.9 in adult 01/11/2022   Pain of left hip 07/01/2021   Mood disorder in conditions classified elsewhere 07/01/2021   Gastroesophageal reflux disease 05/11/2021   Weight gain 11/30/2020   Eating disorder 11/30/2020   History of depression 04/28/2020   History of anxiety 04/28/2020   Intentional drug overdose (HCC)    Severe episode of recurrent major depressive disorder, without psychotic features (HCC) 07/14/2018   Migraine without aura and without status migrainosus, not intractable 05/27/2018   Panic attacks 12/31/2016   Frequent headaches 03/16/2016   Nonintractable juvenile myoclonic epilepsy without status epilepticus (HCC) 04/18/2015     Past Medical History:  Diagnosis Date   Anxiety    Phreesia 09/11/2019   Depression    Phreesia 04/25/2020   Headache    Intentional SSRI (selective serotonin reuptake inhibitor) overdose, initial encounter (HCC) 01/10/2019   Obesity    Overdose of benzodiazepine, intentional self-harm, initial encounter (HCC) 01/10/2019   Seizures (HCC)    Phreesia 09/11/2019   Vision abnormalities  Past medical history comments: See HPI Copied from previous record: She took Keppra  in the past but was changed to Trokendi  XR in 2017 because of ongoing seizures and for migraine prevention. An EEG performed January 08, 2017 was normal. 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 An EEG done on February 07, 2018 was normal  awake and asleep. Keppra  was restarted in 2019 because of seizure activity but stopped due to side effects   Surgical history: Past Surgical History:  Procedure Laterality Date   REMOVAL OF IMPLANON  ROD  06/24/2023   TONSILLECTOMY Bilateral 2007   Performed at [facility]   TYMPANOSTOMY TUBE PLACEMENT Bilateral 2004   Performed at [facility]     Family history: family history includes Anxiety disorder in her maternal aunt, maternal grandmother, and mother; Autism in an other family member; Bipolar disorder in her maternal aunt, maternal grandmother, and mother; Depression in her maternal aunt, maternal grandmother, and mother; Hypertension in her father, maternal grandfather, maternal grandmother, mother, paternal grandfather, and paternal grandmother; Lung cancer in her paternal grandfather; Migraines in her maternal aunt, maternal grandfather, maternal grandmother, and mother; Seizures in an other family member.   Social history: Social History   Socioeconomic History   Marital status: Single    Spouse name: Not on file   Number of children: Not on file   Years of education: Not on file   Highest education level: Not on file  Occupational History   Not on file  Tobacco Use   Smoking status: Smoker, Current Status Unknown    Types: E-cigarettes   Smokeless tobacco: Never  Vaping Use   Vaping status: Never Used  Substance and Sexual Activity   Alcohol use: Yes   Drug use: Yes    Types: Marijuana   Sexual activity: Yes    Birth control/protection: Implant  Other Topics Concern   Not on file  Social History Narrative   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    She is working at [employer]    Lives with her sister.       Social Drivers of Corporate investment banker Strain: Not on file  Food Insecurity: Not on file  Transportation Needs: Not on file  Physical Activity: Not on file  Stress: Not on file  Social Connections: Not on file  Intimate Partner Violence: Not on file     Past/failed meds: Copied from previous record: Trokendi  XR - improved headaches, later changed to immediate release Topiramate  Lamotrigine  - side effects - has tolerated Lamotrigine  ER well  Allergies: Allergies  Allergen Reactions   Meloxicam Other (See Comments)    Caused mood swings   Tape Dermatitis    When regular hospital tape applied to arm, swelling and redness developed at the site. Where paper tape applied, no reaction noted.     Immunizations: Immunization History  Administered Date(s) Administered   Influenza,inj,Quad PF,6+ Mos 01/12/2019   Moderna Sars-Covid-2 Vaccination 02/04/2020   Pfizer Covid-19 Vaccine Bivalent Booster 40yrs & up 04/29/2021    Diagnostics/Screenings: Copied from previous record: rEEG 02/07/18 - normal awake and asleep. FABIENE Abu, MD   rEEG 02/19/16 - abnormal with the patient awake, drowsy and asleep. The presence of generalized spike and wave discharge is consistent with her primary generalized epilepsy and raises the risk of recurrent seizures. FABIENE Abu, MD  Physical Exam: There were no vitals taken for this visit.  Examination limited by video format General: Well developed, well nourished young woman,  seated at her home, in no evident distress Head: Head normocephalic and atraumatic.  Oropharynx benign. Neck: Supple Cardiovascular: Regular rate and rhythm, no murmurs Respiratory: Breath sounds clear to auscultation Musculoskeletal: No obvious deformities or scoliosis Skin: No rashes or neurocutaneous lesions  Neurologic Exam Mental Status: Awake and fully alert.  Oriented to place and time. Attention span, concentration, and fund of knowledge appropriate.  Mood and affect appropriate. Cranial Nerves: Fundoscopic exam reveals sharp disc margins.  Pupils equal, briskly reactive to light.  Extraocular movements full without nystagmus. Hearing intact and symmetric to whisper.  Facial sensation intact.  Face tongue, palate move  normally and symmetrically. Shoulder shrug normal Motor: Normal bulk and tone. Normal strength in all tested extremity muscles. Coordination: No dysmetria with reach for objects   Impression: Nonintractable juvenile myoclonic epilepsy without status epilepticus (HCC) - Plan: LamoTRIgine  300 MG TB24 24 hour tablet  Panic attacks - Plan: FLUoxetine  (PROZAC ) 20 MG tablet  History of depression - Plan: FLUoxetine  (PROZAC ) 20 MG tablet  History of anxiety - Plan: FLUoxetine  (PROZAC ) 20 MG tablet   Recommendations for plan of care: The patient's previous Epic records were reviewed. No recent diagnostic studies to be reviewed with the patient. I talked with Johnella about her move to [place]  and explained that it is important for her to get established with a PCP and make arrangements for that provider to prescribe her medications. I will refill the medications now but will not be able to continue to do so. I asked her to let me know when she makes an appointment with a PCP and I will provide records as needed.   Plan until next visit: Continue medications as prescribed  Call for questions or concerns Follow up in December or sooner if needed  The medication list was reviewed and reconciled. No changes were made in the prescribed medications today. A complete medication list was provided to the patient.  Allergies as of 11/15/2023       Reactions   Meloxicam Other (See Comments)   Caused mood swings   Tape Dermatitis   When regular hospital tape applied to arm, swelling and redness developed at the site. Where paper tape applied, no reaction noted.         Medication List        Accurate as of November 15, 2023  6:21 PM. If you have any questions, ask your nurse or doctor.          FLUoxetine  20 MG tablet Commonly known as: PROZAC  Take 1 tablet per day   LamoTRIgine  300 MG Tb24 24 hour tablet Take 1 tablet (300 mg total) by mouth at bedtime.      Total time spent with the  patient was 15 minutes, of which 50% or more was spent in counseling and coordination of care.  Ellouise Bollman NP-C [facility] [address], [address] [place], [place] [address] Ph. [phone] Fax [phone]

## 2023-11-15 ENCOUNTER — Encounter (INDEPENDENT_AMBULATORY_CARE_PROVIDER_SITE_OTHER): Payer: Self-pay

## 2023-11-15 ENCOUNTER — Encounter (INDEPENDENT_AMBULATORY_CARE_PROVIDER_SITE_OTHER): Payer: Self-pay | Admitting: Family

## 2023-11-15 ENCOUNTER — Telehealth (INDEPENDENT_AMBULATORY_CARE_PROVIDER_SITE_OTHER): Payer: Self-pay | Admitting: Family

## 2023-11-15 DIAGNOSIS — G40B09 Juvenile myoclonic epilepsy, not intractable, without status epilepticus: Secondary | ICD-10-CM

## 2023-11-15 DIAGNOSIS — Z8659 Personal history of other mental and behavioral disorders: Secondary | ICD-10-CM

## 2023-11-15 DIAGNOSIS — F410 Panic disorder [episodic paroxysmal anxiety] without agoraphobia: Secondary | ICD-10-CM | POA: Diagnosis not present

## 2023-11-15 MED ORDER — FLUOXETINE HCL 20 MG PO TABS: ORAL_TABLET | ORAL | 5 refills | Status: AC

## 2023-11-15 MED ORDER — LAMOTRIGINE ER 300 MG PO TB24: 300.0000 mg | ORAL_TABLET | Freq: Every evening | ORAL | 5 refills | Status: DC

## 2023-11-15 NOTE — Patient Instructions (Signed)
 It was a pleasure to see you today!  Instructions for you until your next appointment are as follows: Refills were sent in today Be sure to get established with a PCP as we discussed today and let me know when you have made an appointment Call or send a MyChart message for any questions or concerns Please sign up for MyChart if you have not done so. Please plan to return for follow up in December or sooner if needed.  Feel free to contact our office during normal business hours at [phone] with questions or concerns. If there is no answer or the call is outside business hours, please leave a message and our clinic staff will call you back within the next business day.  If you have an urgent concern, please stay on the line for our after-hours answering service and ask for the on-call neurologist.     I also encourage you to use MyChart to communicate with me more directly. If you have not yet signed up for MyChart within [facility], the front desk staff can help you. However, please note that this inbox is NOT monitored on nights or weekends, and response can take up to 2 business days.  Urgent matters should be discussed with the on-call pediatric neurologist.   At [facility], we are committed to providing exceptional care. You will receive a patient satisfaction survey through text or email regarding your visit today. Your opinion is important to me. Comments are appreciated.

## 2023-11-19 ENCOUNTER — Other Ambulatory Visit (HOSPITAL_COMMUNITY): Payer: Self-pay

## 2023-11-19 ENCOUNTER — Telehealth (INDEPENDENT_AMBULATORY_CARE_PROVIDER_SITE_OTHER): Payer: Self-pay | Admitting: Pharmacy Technician

## 2023-11-19 NOTE — Telephone Encounter (Addendum)
 Pharmacy Patient Advocate Encounter   Received notification from CoverMyMeds that prior authorization for lamoTRIgine  ER 300MG  er tablets is required/requested.   Insurance verification completed.   The patient is insured through [employer] .   Per test claim: The current 90 day co-pay is, $4.00.  No PA needed at this time. This test claim was processed through [facility]- copay amounts may vary at other pharmacies due to pharmacy/plan contracts, or as the patient moves through the different stages of their insurance plan.     **PA was sent with an old insurance.**  Archived Key: [id number]

## 2023-12-18 ENCOUNTER — Encounter (INDEPENDENT_AMBULATORY_CARE_PROVIDER_SITE_OTHER): Payer: Self-pay

## 2024-03-12 ENCOUNTER — Other Ambulatory Visit (INDEPENDENT_AMBULATORY_CARE_PROVIDER_SITE_OTHER): Payer: Self-pay | Admitting: Family

## 2024-03-12 ENCOUNTER — Encounter (INDEPENDENT_AMBULATORY_CARE_PROVIDER_SITE_OTHER): Payer: Self-pay

## 2024-03-12 DIAGNOSIS — G40B09 Juvenile myoclonic epilepsy, not intractable, without status epilepticus: Secondary | ICD-10-CM

## 2024-04-01 ENCOUNTER — Encounter (INDEPENDENT_AMBULATORY_CARE_PROVIDER_SITE_OTHER): Payer: Self-pay

## 2024-04-01 DIAGNOSIS — G40B09 Juvenile myoclonic epilepsy, not intractable, without status epilepticus: Secondary | ICD-10-CM

## 2024-04-03 MED ORDER — LAMOTRIGINE ER 300 MG PO TB24: 300.0000 mg | ORAL_TABLET | Freq: Every evening | ORAL | 2 refills | Status: AC
